# Patient Record
Sex: Female | Born: 1955 | Race: Black or African American | Hispanic: No | Marital: Married | Smoking: Never smoker
Health system: Southern US, Community
[De-identification: ages and names within clinical notes are randomized; demographics above are authoritative.]

## PROBLEM LIST (undated history)

## (undated) ENCOUNTER — Ambulatory Visit: Admission: EM | Payer: Medicare HMO

## (undated) DIAGNOSIS — T7840XA Allergy, unspecified, initial encounter: Secondary | ICD-10-CM

## (undated) DIAGNOSIS — E785 Hyperlipidemia, unspecified: Secondary | ICD-10-CM

## (undated) DIAGNOSIS — E079 Disorder of thyroid, unspecified: Secondary | ICD-10-CM

## (undated) DIAGNOSIS — I82409 Acute embolism and thrombosis of unspecified deep veins of unspecified lower extremity: Secondary | ICD-10-CM

## (undated) HISTORY — DX: Disorder of thyroid, unspecified: E07.9

## (undated) HISTORY — PX: VASCULAR SURGERY: SHX849

## (undated) HISTORY — DX: Allergy, unspecified, initial encounter: T78.40XA

## (undated) HISTORY — DX: Acute embolism and thrombosis of unspecified deep veins of unspecified lower extremity: I82.409

---

## 1998-07-18 HISTORY — PX: CHOLECYSTECTOMY: SHX55

## 1998-07-18 HISTORY — PX: TOTAL NEPHRECTOMY: SHX415

## 2001-08-23 ENCOUNTER — Emergency Department (HOSPITAL_COMMUNITY): Admission: EM | Admit: 2001-08-23 | Discharge: 2001-08-23 | Payer: Self-pay | Admitting: *Deleted

## 2001-09-20 ENCOUNTER — Emergency Department (HOSPITAL_COMMUNITY): Admission: EM | Admit: 2001-09-20 | Discharge: 2001-09-20 | Payer: Self-pay | Admitting: Emergency Medicine

## 2002-04-15 ENCOUNTER — Emergency Department (HOSPITAL_COMMUNITY): Admission: EM | Admit: 2002-04-15 | Discharge: 2002-04-15 | Payer: Self-pay | Admitting: Emergency Medicine

## 2005-02-15 ENCOUNTER — Encounter (INDEPENDENT_AMBULATORY_CARE_PROVIDER_SITE_OTHER): Payer: Self-pay | Admitting: Internal Medicine

## 2005-02-15 LAB — CONVERTED CEMR LAB: Pap Smear: NORMAL

## 2006-10-29 ENCOUNTER — Ambulatory Visit: Payer: Self-pay | Admitting: Internal Medicine

## 2006-11-02 ENCOUNTER — Encounter: Payer: Self-pay | Admitting: Internal Medicine

## 2006-11-02 DIAGNOSIS — E785 Hyperlipidemia, unspecified: Secondary | ICD-10-CM

## 2006-11-02 DIAGNOSIS — J309 Allergic rhinitis, unspecified: Secondary | ICD-10-CM | POA: Insufficient documentation

## 2006-11-02 DIAGNOSIS — K219 Gastro-esophageal reflux disease without esophagitis: Secondary | ICD-10-CM

## 2007-02-19 ENCOUNTER — Encounter (INDEPENDENT_AMBULATORY_CARE_PROVIDER_SITE_OTHER): Payer: Self-pay | Admitting: Internal Medicine

## 2008-05-18 ENCOUNTER — Ambulatory Visit: Payer: Self-pay | Admitting: Internal Medicine

## 2008-05-18 DIAGNOSIS — R634 Abnormal weight loss: Secondary | ICD-10-CM

## 2008-05-23 LAB — CONVERTED CEMR LAB
AST: 14 units/L (ref 0–37)
Albumin: 4.1 g/dL (ref 3.5–5.2)
Basophils Absolute: 0 10*3/uL (ref 0.0–0.1)
Basophils Relative: 0 % (ref 0–1)
CO2: 27 meq/L (ref 19–32)
Calcium: 9 mg/dL (ref 8.4–10.5)
Chloride: 107 meq/L (ref 96–112)
Eosinophils Absolute: 0.1 10*3/uL (ref 0.0–0.7)
HCT: 38.9 % (ref 36.0–46.0)
Hemoglobin: 12.4 g/dL (ref 12.0–15.0)
LDL Cholesterol: 152 mg/dL — ABNORMAL HIGH (ref 0–99)
MCV: 93.3 fL (ref 78.0–100.0)
Monocytes Relative: 9 % (ref 3–12)
Neutro Abs: 3.5 10*3/uL (ref 1.7–7.7)
Neutrophils Relative %: 57 % (ref 43–77)
Platelets: 271 10*3/uL (ref 150–400)
Potassium: 3.9 meq/L (ref 3.5–5.3)
RBC: 4.17 M/uL (ref 3.87–5.11)
RDW: 13.4 % (ref 11.5–15.5)
Sodium: 144 meq/L (ref 135–145)
Total Bilirubin: 0.4 mg/dL (ref 0.3–1.2)
Total Protein: 7.8 g/dL (ref 6.0–8.3)
Triglycerides: 62 mg/dL (ref <150)

## 2008-05-24 ENCOUNTER — Ambulatory Visit (HOSPITAL_COMMUNITY): Admission: RE | Admit: 2008-05-24 | Discharge: 2008-05-24 | Payer: Self-pay | Admitting: Internal Medicine

## 2008-05-26 ENCOUNTER — Telehealth (INDEPENDENT_AMBULATORY_CARE_PROVIDER_SITE_OTHER): Payer: Self-pay | Admitting: *Deleted

## 2008-05-30 ENCOUNTER — Ambulatory Visit: Payer: Self-pay | Admitting: Internal Medicine

## 2008-05-30 DIAGNOSIS — N289 Disorder of kidney and ureter, unspecified: Secondary | ICD-10-CM | POA: Insufficient documentation

## 2008-05-30 DIAGNOSIS — E042 Nontoxic multinodular goiter: Secondary | ICD-10-CM

## 2008-06-01 ENCOUNTER — Encounter (INDEPENDENT_AMBULATORY_CARE_PROVIDER_SITE_OTHER): Payer: Self-pay | Admitting: Internal Medicine

## 2008-06-01 ENCOUNTER — Ambulatory Visit (HOSPITAL_COMMUNITY): Admission: RE | Admit: 2008-06-01 | Discharge: 2008-06-01 | Payer: Self-pay | Admitting: Internal Medicine

## 2008-06-06 ENCOUNTER — Ambulatory Visit: Payer: Self-pay | Admitting: Internal Medicine

## 2008-06-21 ENCOUNTER — Ambulatory Visit (HOSPITAL_COMMUNITY): Admission: RE | Admit: 2008-06-21 | Discharge: 2008-06-21 | Payer: Self-pay | Admitting: Internal Medicine

## 2008-06-21 ENCOUNTER — Ambulatory Visit: Payer: Self-pay | Admitting: Internal Medicine

## 2008-06-21 ENCOUNTER — Encounter (INDEPENDENT_AMBULATORY_CARE_PROVIDER_SITE_OTHER): Payer: Self-pay | Admitting: Internal Medicine

## 2008-06-21 ENCOUNTER — Encounter: Payer: Self-pay | Admitting: Internal Medicine

## 2009-08-15 ENCOUNTER — Encounter (INDEPENDENT_AMBULATORY_CARE_PROVIDER_SITE_OTHER): Payer: Self-pay | Admitting: Internal Medicine

## 2009-12-04 IMAGING — CT CT PELVIS W/ CM
1 of 3 series · 14 of 32 positions shown, 19 images · IV contrast (Omnipaque 300)
Comparison: None

CT ABDOMEN

CLINICAL DATA: Weight loss.

CT ABDOMEN AND PELVIS WITH CONTRAST
TECHNIQUE: Multidetector CT imaging of the abdomen and pelvis was
performed using the standard protocol following bolus
administration of intravenous contrast.
Contrast: 100 ml of omni 300

[Series 2: abd_pel 5.0 b40f · axial · 0.67mm/px · z∈[-418,-72]mm · 14 of 79 slices shown, 19 images]
[im 5/79  soft-tissue]
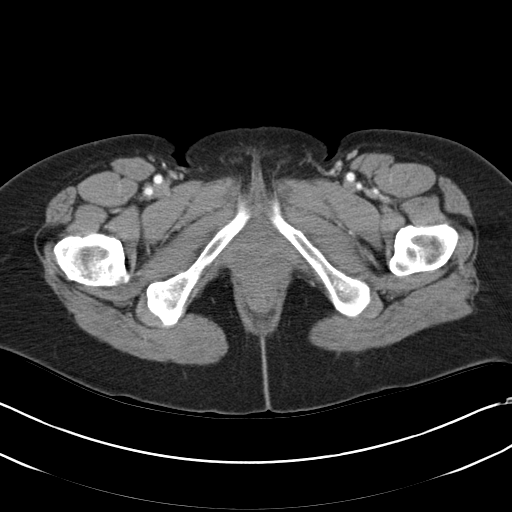
[im 5/79  bone]
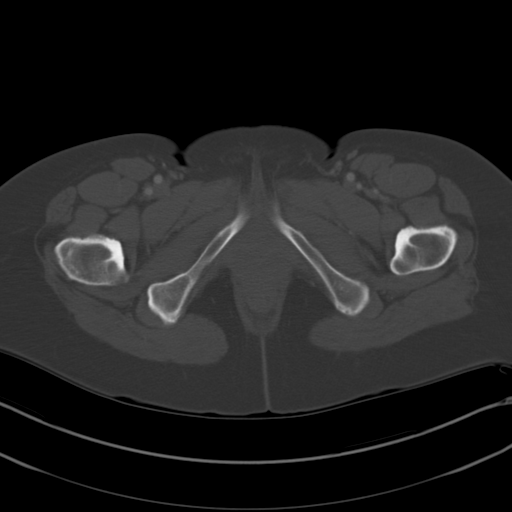
[im 9/79  soft-tissue]
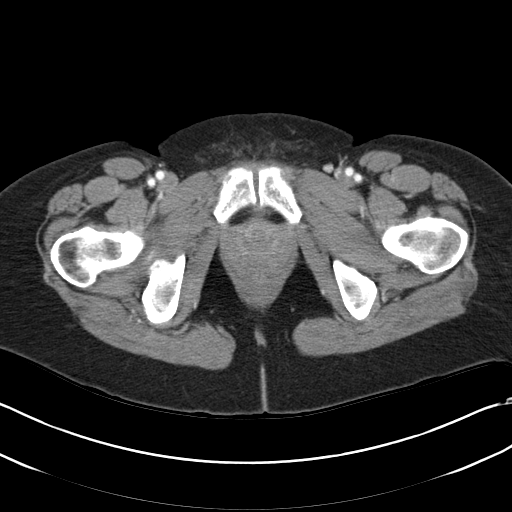
[im 18/79  soft-tissue]
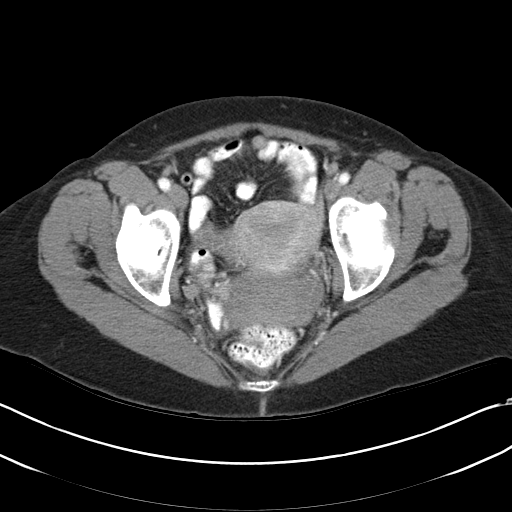
[im 22/79  soft-tissue]
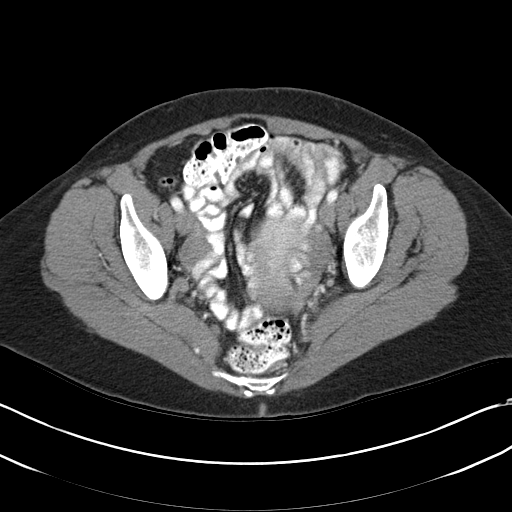
[im 27/79  soft-tissue]
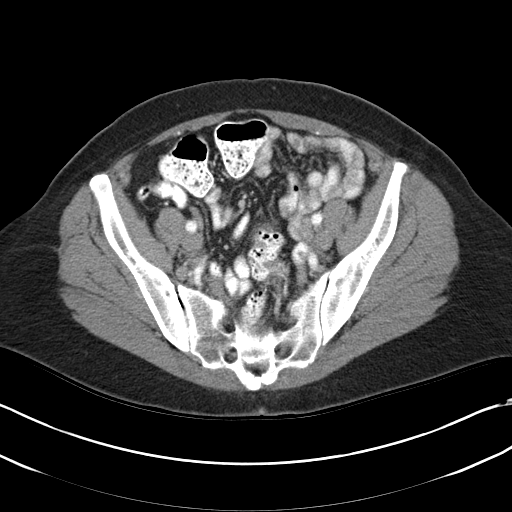
[im 35/79  soft-tissue]
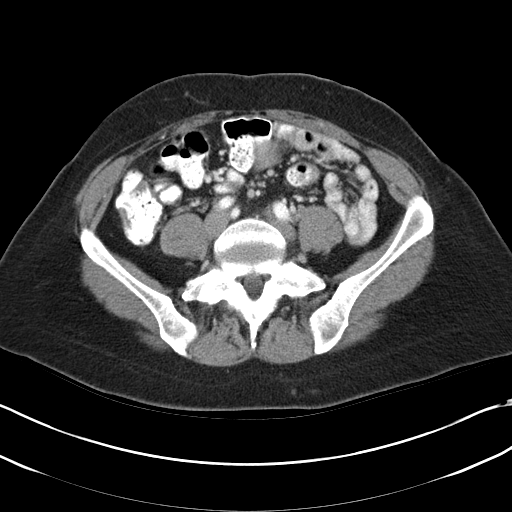
[im 40/79  soft-tissue]
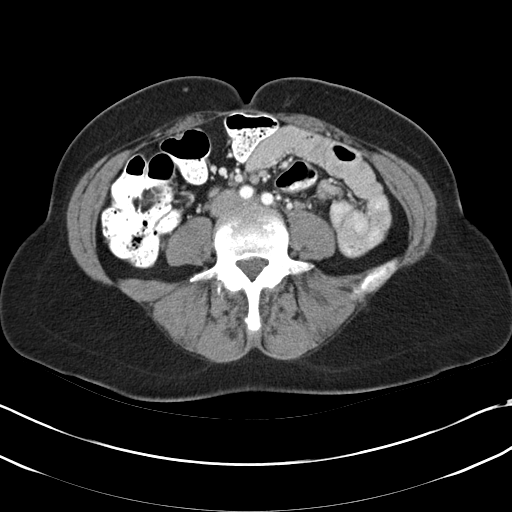
[im 44/79  soft-tissue]
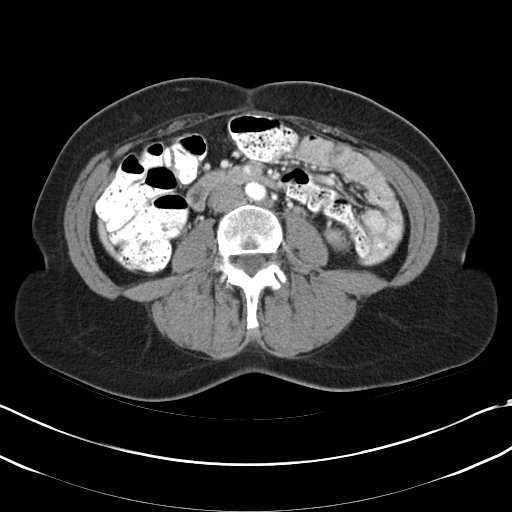
[im 53/79  soft-tissue]
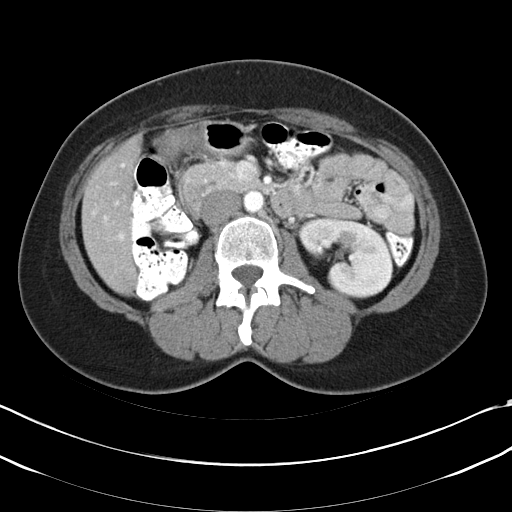
[im 53/79  bone]
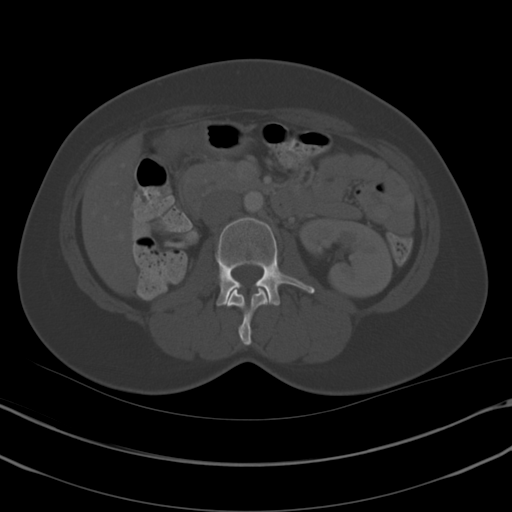
[im 57/79  soft-tissue]
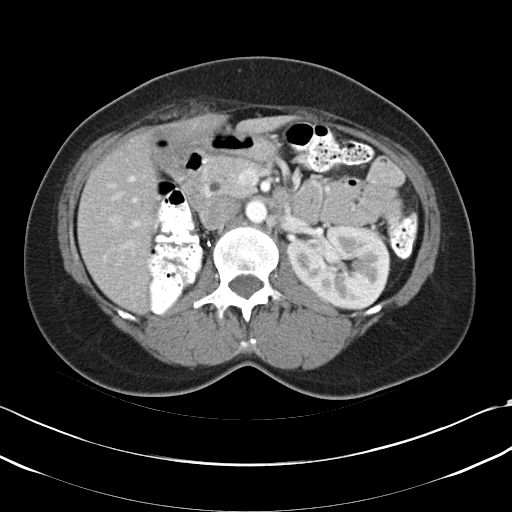
[im 61/79  soft-tissue]
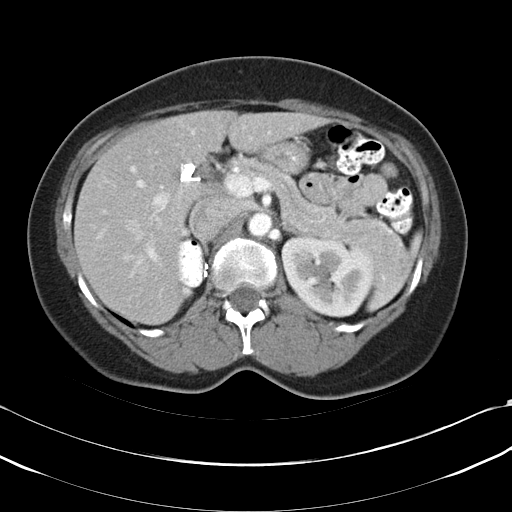
[im 61/79  lung]
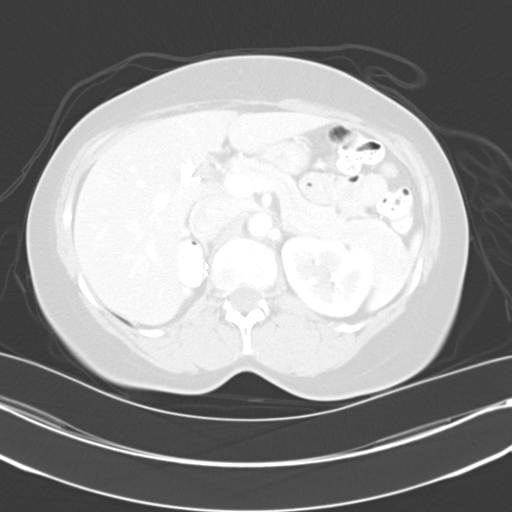
[im 66/79  lung]
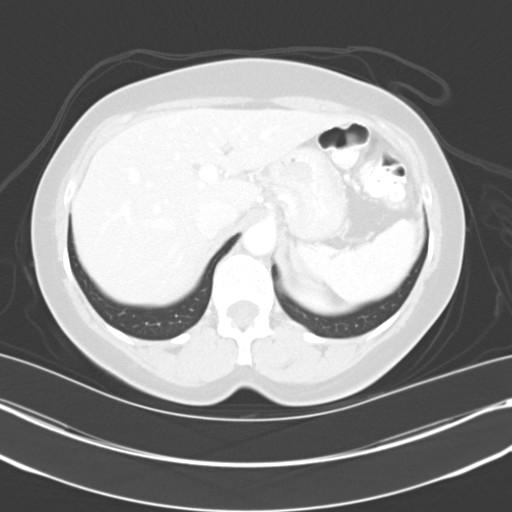
[im 70/79  soft-tissue]
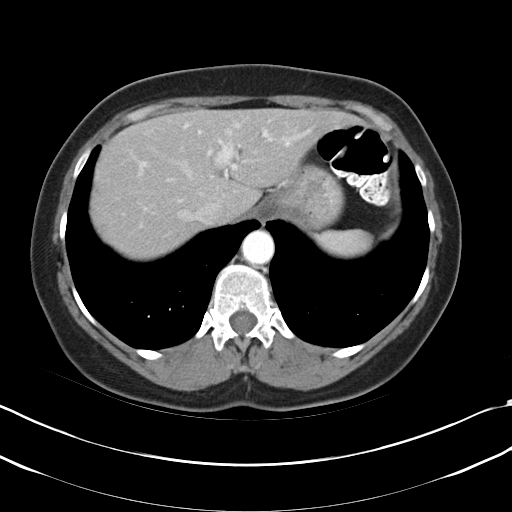
[im 70/79  lung]
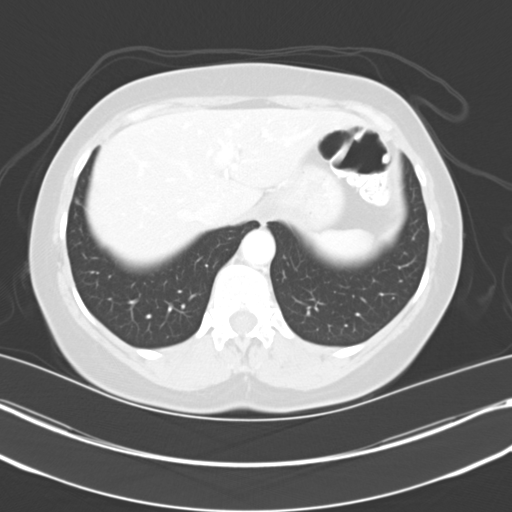
[im 74/79  soft-tissue]
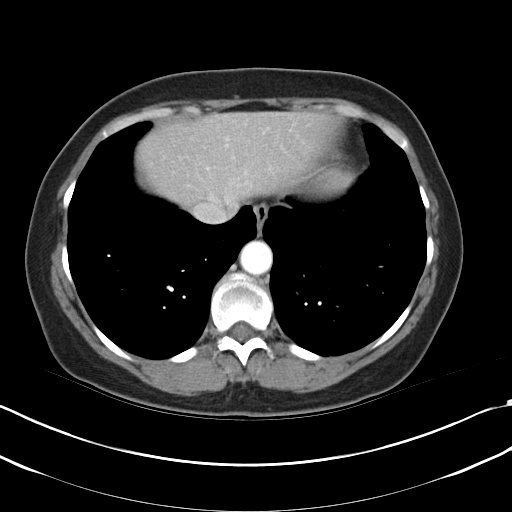
[im 74/79  lung]
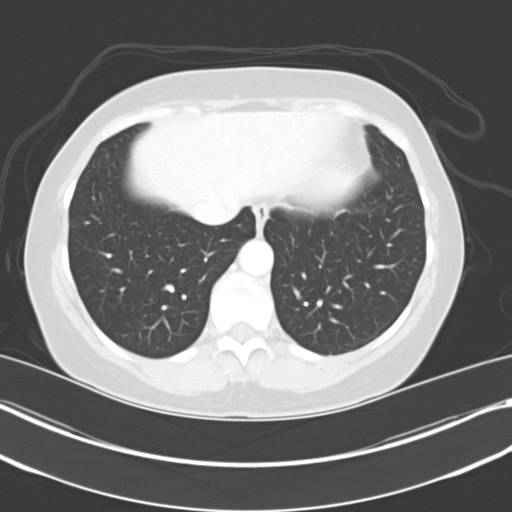

[14 of 32 positions shown; findings below may reference images not displayed]

FINDINGS: The lung bases are clear.

There is no pericardial or pleural fluid.

Spleen negative.

Adrenal glands negative.

The patient is status post cholecystectomy.  The common bile duct
is mildly increased in caliber measuring 8 mm. There is no
intrahepatic biliary ductal dilatation.

There is mild fatty infiltration of the liver.Within the right
hepatic lobe there is a low density lesion measuring 8.7 mm, image
26.  This is too small to characterize.

The pancreas appears normal

The patient is status post right nephrectomy.  The left kidney
appears normal

There is no enlarged retroperitoneal or small bowel mesenteric
lymph nodes.

No free fluid or abnormal fluid collections noted.

The appendix is identified the right lower quadrant and appears
normal.

Review of the visualized osseous structures is unremarkable.
IMPRESSION: 1.  Negative for mass or adenopathy.
2.  8.7 mm hypodensity in the right hepatic lobe is too small to
characterize.
3.  Status post right nephrectomy.

CT PELVIS
FINDINGS: Urinary bladder is negative.

The uterus and the adnexal structures are unremarkable.

There are no enlarged pelvic or inguinal lymph nodes.

The pelvic bowel loops are unremarkable.

There is no free fluid, or abnormal fluid collections.

No pelvic masses identified.

Review of the visualized osseous structures shows facet hypertrophy
and degenerative change.
IMPRESSION: 1.  Negative for mass or adenopathy.

## 2010-01-29 ENCOUNTER — Ambulatory Visit: Payer: Self-pay | Admitting: Family Medicine

## 2010-01-29 DIAGNOSIS — R5383 Other fatigue: Secondary | ICD-10-CM

## 2010-01-29 DIAGNOSIS — R5381 Other malaise: Secondary | ICD-10-CM

## 2010-02-05 ENCOUNTER — Ambulatory Visit (HOSPITAL_COMMUNITY): Admission: RE | Admit: 2010-02-05 | Discharge: 2010-02-05 | Payer: Self-pay | Admitting: Family Medicine

## 2010-02-25 ENCOUNTER — Encounter: Payer: Self-pay | Admitting: Family Medicine

## 2010-03-18 ENCOUNTER — Encounter: Payer: Self-pay | Admitting: Family Medicine

## 2010-04-11 ENCOUNTER — Ambulatory Visit: Payer: Self-pay | Admitting: Family Medicine

## 2010-04-12 LAB — CONVERTED CEMR LAB
CO2: 26 meq/L (ref 19–32)
Chloride: 103 meq/L (ref 96–112)
Creatinine, Ser: 0.79 mg/dL (ref 0.40–1.20)
Glucose, Bld: 80 mg/dL (ref 70–99)
Sodium: 141 meq/L (ref 135–145)

## 2010-12-17 NOTE — Assessment & Plan Note (Signed)
Summary: new pt   Vital Signs:  Patient profile:   55 year old female Height:      65 inches Weight:      150 pounds BMI:     25.05 O2 Sat:      98 % Pulse rate:   84 / minute Pulse rhythm:   regular Resp:     16 per minute BP sitting:   134 / 90  (left arm) Cuff size:   regular  Vitals Entered By: Everitt Amber LPN (January 29, 2010 10:34 AM)  Nutrition Counseling: Patient's BMI is greater than 25 and therefore counseled on weight management options. CC: New Patient, wants thyroid checked, her pressure went up last year and her left ankle swelled up and now she has veins and spots on her lower leg Is Patient Diabetic? No   CC:  New Patient, wants thyroid checked, and her pressure went up last year and her left ankle swelled up and now she has veins and spots on her lower leg.  History of Present Illness: this is a new pt evaluation for this patient whose main concerns are regarding having her thyroid gland checked and re-establishment of care. She is not up to date in her preventive care and also wants this addressed. She denies any recent fever or chills. She denies any regular exercise and she has actually gained weight. she reports being somewhat stressed and overwhelmed, between work, school and home making.  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  Past Medical  History: Reviewed history from 05/30/2008 and no changes required. Allergic rhinitis GERD Hyperlipidemia ? kidney cancer Multinodular goiter   Past Surgical History: Reviewed history from 05/18/2008 and no changes required. Nephrectomy-right-1999 Cholecystectomy  Family History: Reviewed history from 03/16/2007 and no changes required. father--CVA, HTN mother sister x 2 both with thyroid disease brother-x1 healthy  Social History: Reviewed history from 05/18/2008 and no changes required. Married lives with spouse and son Alcohol use-yes-socially Never Smoked Drug use-no Health visitor and nutrition products, Designer, jewellery for healthcare profession two sons , both healthy  Review of Systems      See HPI General:  Denies chills and fever. Eyes:  Denies blurring, discharge, double vision, and red eye. ENT:  Denies earache, hoarseness, nasal congestion, and sinus pressure. CV:  Denies chest pain or discomfort, palpitations, and swelling of feet. Resp:  Denies cough and sputum productive. GI:  Denies abdominal pain, constipation, diarrhea, nausea, and vomiting. GU:  Denies dysuria, incontinence, and urinary frequency. MS:  Denies joint pain, low back pain, mid back pain, muscle weakness, and stiffness. Derm:  Complains of rash; non puritic rash on legs, also hypopigmenrted lesions on legs, mom has vitiligo. Neuro:  Complains of headaches; denies seizures, sensation of room spinning, tingling, and tremors; opccasional headaches, esp under stress. Psych:  Complains of anxiety; denies depression, sense of great danger, suicidal thoughts/plans, thoughts of violence, and unusual visions or sounds. Endo:  Denies cold intolerance, excessive hunger, excessive thirst, excessive  urination, and heat intolerance. Heme:  Complains of abnormal bruising; bruising on legs. Allergy:  Denies hives or rash and sneezing.  Physical Exam  General:  Well-developed,well-nourished,in no acute  distress; alert,appropriate and cooperative throughout examination HEENT: No facial asymmetry,  EOMI, No sinus tenderness, TM's Clear, oropharynx  pink and moist. thyromegaly  Chest: Clear to auscultation bilaterally.  CVS: S1, S2, No murmurs, No S3.   Abd: Soft, Nontender.  MS: Adequate ROM spine, hips, shoulders and knees.  Ext: No edema.   CNS: CN 2-12 intact, power tone and sensation normal throughout.   Skin: Intact, no visible lesions or rashes.  Psych: Good eye contact, normal affect.  Memory intact, not anxious or depressed appearing.    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 the importance of healthy nutrition, regular exercise , and adequate rest was stressed.  Other Orders: T-Lipid Profile [phone]) T-Vitamin D (25-Hydroxy) [phone]) Tdap => 22yrs IM ([id number]) Admin 1st Vaccine (34742) Admin 1st Vaccine [facility]) [phone])  Patient Instructions: 1)  CPE in 2 months. 2)  BMP prior to visit, ICD-9: 3)  Lipid Panel prior to visit, ICD-9: 4)  TSH prior to visit, ICD-9:   fasting asap 5)  CBC w/ Diff prior to visit, ICD-9: 6)  vit d  7)  It is important that you exercise regularly at least 20 minutes 5 times a week. If you develop chest pain, have severe difficulty breathing, or feel very tired , stop exercising immediately and seek medical attention. 8)  You need to lose weight. Consider a lower calorie diet and regular exercise.  9)  Check your Blood Pressure regularly. If it is above150/90: you should make an appointment.    Tetanus/Td Vaccine    Vaccine Type: Tdap     Site: right deltoid    Mfr: Sanofi Pasteur    Dose: 0.5 ml    Route: IM    Given by: Everitt Amber LPN    Exp. Date: 02/2012    Lot #: [id number]

## 2010-12-17 NOTE — Progress Notes (Signed)
Summary: ENT  ENT   Imported By: Lind Guest 02/26/2010 12:57:32  _____________________________________________________________________  External Attachment:    Type:   Image     Comment:   External Document

## 2010-12-17 NOTE — Progress Notes (Signed)
Summary: ENT  ENT   Imported By: Lind Guest 03/19/2010 11:27:43  _____________________________________________________________________  External Attachment:    Type:   Image     Comment:   External Document

## 2010-12-17 NOTE — Assessment & Plan Note (Signed)
Summary: office visit   Vital Signs:  Patient profile:   55 year old female Height:      65 inches Weight:      147 pounds BMI:     24.55 O2 Sat:      97 % on Room air Pulse rate:   88 / minute Resp:     16 per minute BP sitting:   112 / 70  (left arm)  Vitals Entered By: Adella Hare LPN (Apr 11, 2010 10:50 AM)  O2 Flow:  Room air CC: follow-up visit Is Patient Diabetic? No Pain Assessment Patient in pain? no        CC:  follow-up visit.  History of Present Illness: Prt states she feels muchj better knowing that the swelling in her neck was nothing to be concerned about and thateNT would follow her on an intermittentbasis. She reports continued and increased stress, between work as welll as as hr studies, ansd she unfortunately lost 3 peopleclose to herin the month of April;. She diloes have a good support system to get her through this.  Current Medications (verified): 1)  None  Allergies (verified): No Known Drug Allergies  Review of Systems      See HPI General:  Complains of fatigue and weight loss; denies chills and fever. Eyes:  Denies blurring and discharge. ENT:  Denies hoarseness, nasal congestion, sinus pressure, and sore throat. CV:  Denies chest pain or discomfort, palpitations, and swelling of feet. Resp:  Denies cough and sputum productive. GI:  Denies abdominal pain, constipation, diarrhea, nausea, and vomiting. GU:  Denies dysuria and urinary frequency. MS:  Denies joint pain and stiffness. Derm:  Denies itching and rash. Neuro:  Denies headaches, inability to speak, and memory loss. Psych:  acute grief  lost 3 people close to her in April, crying spells, excessive crying . Endo:  Denies excessive thirst, excessive urination, and polyuria; father has diabetes and grecent lab data show an elevated HBA1C , this will be repeated. Heme:  Denies abnormal bruising and bleeding. Allergy:  Denies hives or rash and itching eyes.  Physical  Exam  General:  Well-developed,well-nourished,in no acute distress; alert,appropriate and cooperative throughout examination HEENT: No facial asymmetry,  EOMI, No sinus tenderness, TM's Clear, oropharynx  pink and moist. thyromegaly  Chest: Clear to auscultation bilaterally.  CVS: S1, S2, No murmurs, No S3.   Abd: Soft, Nontender.  MS: Adequate ROM spine, hips, shoulders and knees.  Ext: No edema.   CNS: CN 2-12 intact, power tone and sensation normal throughout.   Skin: Intact, no visible lesions or rashes.  Psych: Good eye contact, normal affect.  Memory intact, not anxious or depressed appearing.    Impression & Recommendations:  Problem # 1:  IMPAIRED FASTING GLUCOSE (ICD-790.21) Assessment Comment Only  Orders: T- Hemoglobin A1C ([id number]), recent oputside lab has a value of 6.9, pt advised she needs tochange her diet and commit to reg exercise as she islikely diabetic , she will be contacted in the morning when labs become available  Labs Reviewed: Creat: 0.86 (05/18/2008)     Problem # 2:  GOITER, NONTOXIC MULTINODULAR (ICD-241.1) Assessment: Unchanged pt reassured of benign nature at this time  Other Orders: T-Basic Metabolic Panel [phone]) T-Vitamin D (25-Hydroxy) [phone])  Patient Instructions: 1)  Please schedule a follow-up appointment in 6 to 7 weeks. 2)  chem7 and HBA1c today. 3)  we will call with result tomorrow, recent labs suggest you are diabetic, pls start low carb diet with  no conc /low conc sweets. 4)  Eat alot of vegetables and somefruit, and drink water or no cal drinks. 5)  Youyur blood pressure is excellentat 120/80 6)  It is important that you exercise regularly at least 30 minutes 6 times a week. If you develop chest pain, have severe difficulty breathing, or feel very tired , stop exercising immediately and seek medical attention. 7)  You need to lose weight. Consider a lower calorie diet and regular exercise.

## 2011-04-01 NOTE — H&P (Signed)
Carolyn Raymond           ACCOUNT NO.:  [id number]   MEDICAL RECORD NO.:  [id number]          Raymond TYPE:  OUT   LOCATION:  RAD                           FACILITY:  [facility]   PHYSICIAN:  R. Roetta Sessions, M.D. DATE OF BIRTH:  [birth year]   DATE OF ADMISSION:  06/01/2008  DATE OF DISCHARGE:  07/16/2009LH                              HISTORY & PHYSICAL   REASON FOR CONSULTATION:  Weight loss, need for colonoscopy.   REFERRING PHYSICIAN:  Erle Crocker, MD.   HISTORY OF PRESENT ILLNESS:  Carolyn Raymond is a very pleasant 55-  year-old [place] American female from [place] who was referred here  by Erle Crocker.   It has been noted that Carolyn Raymond has sustained a 10-pound weight loss  insidiously over Carolyn past 3 months.  This has not been associated with  nausea, vomiting, or change in bowel habits.  She has not had any  abdominal pain aside from a self-limiting episode of right upper  quadrant abdominal pain, nausea, and vomiting on Memorial Day weekend at  her sister's back in May.   She denies odynophagia, dysphagia, early satiety, reflux symptoms.  She  moves her bowels once daily.  No melena or hematochezia.  She has a  history of a thyroid goiter.  She has had at least one thyroid  ultrasound.  Her TSH was determined to be normal recently (see below).  Carolyn Raymond has never had her lower GI tract evaluated.  There is  positive family history of colonic polyps in her father, but no family  history of colorectal neoplasia.   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 Rito Ehrlich and Elpidio Anis), history of thyroid goiter.   PAST SURGERIES:  Other surgery include cholecystectomy.   CURRENT MEDICATIONS:  1. Antioxidant tablets daily.  2. Vitamin C daily.   ALLERGIES:  No known drug allergies.   FAMILY HISTORY:  Father is alive with history of CVA, hypertension, and  colonic polyps.  Mother is alive with an  apparent good health.  Two  sisters and one brother in good health.   SOCIAL HISTORY:  Carolyn Raymond is married.  She has two children.  Lives  here in [place].  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  No tobacco.  No alcohol.  No illicit drugs.   REVIEW OF SYSTEMS:  No chest pain, dyspnea on exertion.  No fever,  chills, or night sweats.  Otherwise, as in history of present illness.   PHYSICAL EXAMINATION:  Pleasant 55 year old lady, resting comfortably.  Weight 140, height 5 feet 5, temp 98.5, BP 110/68, pulse 80.  SKIN:  Warm and dry.  HEENT:  No scleral icterus.  Conjunctivae are pink.  Oral cavity no  lesions.  CHEST:  Lungs are clear to auscultation.  CARDIAC:  Regular rate and rhythm without murmur, gallop, or rub.  ABDOMEN:  Nondistended.  Positive bowel sounds, soft, entirely nontender  without appreciable mass or organomegaly.  EXTREMITIES:  No edema.  RECTAL:  Exam deferred.  She will have colonoscopy.   IMPRESSIONS:  Carolyn Raymond is a pleasant 51-year lady  essentially devoid of any GI symptoms aside from a self-limiting episode  of right upper quadrant pain, nausea, and vomiting within 48 hours 2  months ago who now comes for further evaluation of insidious weight loss  and consideration of colorectal cancer screening.  At this point, she  has no specific GI or other symptoms.  It sounds like her thyroid issues  are being addressed by Dr. Jen Mow.   At this point, I agree with Dr. Jen Mow.  Carolyn Raymond certainly needs to  have a colonoscopy largely for screening purposes at this point.  Discussed Carolyn risks, benefits, alternatives, and limitations of  screening colonoscopy at this time.  Her questions were answered, and  she is agreeable.  We will plan to set up colonoscopy in Carolyn very near  future.   I would like to thank Dr. Jen Mow for allowing [place] to see this nice lady  today.      Jonathon Bellows, M.D.  Electronically Signed     RMR/MEDQ   D:  06/06/2008  T:  06/07/2008  Job:  [id number]

## 2011-04-01 NOTE — Op Note (Signed)
NAMESHARNETTA, GIELOW           ACCOUNT NO.:  [id number]   MEDICAL RECORD NO.:  [id number]          PATIENT TYPE:  AMB   LOCATION:  DAY                           FACILITY:  [facility]   PHYSICIAN:  R. Roetta Sessions, M.D. DATE OF BIRTH:  07/28/1956   DATE OF PROCEDURE:  DATE OF DISCHARGE:                               OPERATIVE REPORT   INDICATIONS FOR PROCEDURE:  A 55 year old lady with no GI tract symptoms  currently, again comes for colorectal cancer screening.  She has had  insidious 10-pound weight loss over the past 3 months, but otherwise  entirely asymptomatic.  From a GI standpoint, there is no family history  of colorectal neoplasia.  She has never had a lower GI tract imaged  previously.  Colonoscopy is now being done as a screening maneuver.  Risks, benefits, alternatives, and limitations have been reviewed,  questions answered.  She is agreeable.  Please see the documentation in  the medical record.   PROCEDURE NOTE:  O2 saturation, blood pressure, pulse, and respirations  monitored throughout the entire procedure.   CONSCIOUS SEDATION:  Versed 4 mg IV and Demerol 75 mg IV in divided  doses.   INSTRUMENT:  Pentax video chip system.   FINDINGS:  Digital rectal exam revealed no abnormalities. Endoscopic  findings: The prep was good.  Colon: Colonic mucosa was surveyed from  the rectosigmoid junction through the left transverse, right colon,  appendiceal orifice, ileocecal valve, and cecum. These structures were  well seen and photographed for the record.  The right terminal ileum was  intubated to 10 cm.  From this level, the scope was slowly withdrawn.  All previously mentioned mucosal surfaces were again seen.  The patient  was noted to have left-sided diverticula,  nonbleeding small AVM and  hepatic flexure.  The terminal ileal mucosa appeared normal.  The  colonic mucosa otherwise appeared normal except for a single diminutive  polyp at the rectosigmoid junction,  which was cold biopsied and  recovered through the scope.  The scope was pulled down to the rectum  with examination of rectal mucosa, including retroflexed view of the  anal verge, demonstrated no abnormalities.  The patient tolerated the  procedure well and was reacted in Endoscopy.   IMPRESSION:  1. Normal rectum, diminutive polyp at rectosigmoid, status post cold      biopsy removal.  2. 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  Remainder of the colonic mucosa and      terminal ileal mucosa appeared normal.   RECOMMENDATIONS:  1. Follow up on path.  2. Diverticulosis.  Literature provided to [name].  3. Further recommendations to follow.      Jonathon Bellows, M.D.  Electronically Signed     RMR/MEDQ  D:  06/21/2008  T:  06/22/2008  Job:  [id number]   cc:   Erle Crocker, M.D.

## 2013-07-04 ENCOUNTER — Ambulatory Visit (INDEPENDENT_AMBULATORY_CARE_PROVIDER_SITE_OTHER): Payer: BC Managed Care – PPO | Admitting: Family Medicine

## 2013-07-04 ENCOUNTER — Encounter: Payer: Self-pay | Admitting: Family Medicine

## 2013-07-04 ENCOUNTER — Other Ambulatory Visit: Payer: Self-pay | Admitting: Family Medicine

## 2013-07-04 VITALS — BP 104/72 | HR 80 | Temp 98.1°F | Resp 18 | Ht 63.25 in | Wt 139.0 lb

## 2013-07-04 DIAGNOSIS — Z1321 Encounter for screening for nutritional disorder: Secondary | ICD-10-CM

## 2013-07-04 DIAGNOSIS — Z1231 Encounter for screening mammogram for malignant neoplasm of breast: Secondary | ICD-10-CM

## 2013-07-04 DIAGNOSIS — R634 Abnormal weight loss: Secondary | ICD-10-CM

## 2013-07-04 DIAGNOSIS — Z130 Encounter for screening for diseases of the blood and blood-forming organs and certain disorders involving the immune mechanism: Secondary | ICD-10-CM

## 2013-07-04 DIAGNOSIS — E785 Hyperlipidemia, unspecified: Secondary | ICD-10-CM

## 2013-07-04 DIAGNOSIS — Z124 Encounter for screening for malignant neoplasm of cervix: Secondary | ICD-10-CM

## 2013-07-04 DIAGNOSIS — Z0000 Encounter for general adult medical examination without abnormal findings: Secondary | ICD-10-CM

## 2013-07-04 DIAGNOSIS — R109 Unspecified abdominal pain: Secondary | ICD-10-CM

## 2013-07-04 DIAGNOSIS — B9689 Other specified bacterial agents as the cause of diseases classified elsewhere: Secondary | ICD-10-CM

## 2013-07-04 DIAGNOSIS — N760 Acute vaginitis: Secondary | ICD-10-CM

## 2013-07-04 DIAGNOSIS — A499 Bacterial infection, unspecified: Secondary | ICD-10-CM

## 2013-07-04 DIAGNOSIS — E041 Nontoxic single thyroid nodule: Secondary | ICD-10-CM

## 2013-07-04 DIAGNOSIS — E079 Disorder of thyroid, unspecified: Secondary | ICD-10-CM

## 2013-07-04 LAB — URINALYSIS, ROUTINE W REFLEX MICROSCOPIC
Bilirubin Urine: NEGATIVE
Glucose, UA: NEGATIVE mg/dL
Ketones, ur: NEGATIVE mg/dL
Leukocytes, UA: NEGATIVE
Nitrite: NEGATIVE
Specific Gravity, Urine: >1.03 — ABNORMAL HIGH (ref 1.005–1.030)

## 2013-07-04 LAB — URINALYSIS, MICROSCOPIC ONLY: Crystals: NONE SEEN

## 2013-07-04 LAB — WET PREP FOR TRICH, YEAST, CLUE

## 2013-07-04 MED ORDER — METRONIDAZOLE 500 MG PO TABS: 500.0000 mg | ORAL_TABLET | Freq: Two times a day (BID) | ORAL | Status: DC

## 2013-07-04 NOTE — Progress Notes (Signed)
  Subjective:    Patient ID: Carolyn Raymond, female    DOB: [birth year], 57 y.o.   MRN: [id number]  HPI  Pt here to establish care, previous PCP Dr. Lodema Hong, last seeen in 2011. She is followed by ENT- Dr. Andrey Campanile due to thyroid nodules which are benign, had ultrasound done in January of this year. No current meds, history reviewed Complains of abd pain over site of scar from right nephrectomy for benign tumor and cholecystectomy done in 1999. Has pain when she stretches out at night, denies N/V, diarrhea, pain with eating.  Complains of vaginal discharge for past couple of weeks, no pelvic pain or dysuria.  History of hyperlipidemia- has not had labs in > 3 years  Review of Systems - per above  GEN- denies fatigue, fever, weight loss,weakness, recent illness HEENT- denies eye drainage, change in vision, nasal discharge, CVS- denies chest pain, palpitations RESP- denies SOB, cough, wheeze ABD- denies N/V, change in stools, +abd pain GU- denies dysuria, hematuria, dribbling, incontinence MSK- denies joint pain, muscle aches, injury Neuro- denies headache, dizziness, syncope, seizure activity      Objective:   Physical Exam GEN- NAD, alert and oriented x3 HEENT- PERRL, EOMI, non injected sclera, pink conjunctiva, MMM, oropharynx clear, TM clear bilat Neck- Supple, no thyromegaly CVS- RRR, no murmur RESP-CTAB Breast- normal symmetry, no nipple inversion,no nipple drainage, no nodules or lumps felt Nodes- no axillary nodes ABD-NABS,soft, mild TTP RUQ over previous scar, no rebound, no guarding GU- normal external genitalia, vaginal mucosa pink and moist, cervix visualized no growth, no blood form os, +discharge, no CMT, no ovarian masses, uterus normal size EXT- No edema Pulses- Radial, DP- 2+ Psych- teary eyed discussing father, not overly depressed or anxious appearing       Assessment & Plan:

## 2013-07-04 NOTE — Patient Instructions (Addendum)
I recommend eye visit once a year I recommend dental visit every 6 months Goal is to  Exercise 30 minutes 5 days a week We will call with lab results Start multivitamin Take the flagyl  Records release from Dr. Andrey CampanileJonita Albee [place] F/U 4 months

## 2013-07-05 DIAGNOSIS — G8929 Other chronic pain: Secondary | ICD-10-CM | POA: Insufficient documentation

## 2013-07-05 LAB — COMPREHENSIVE METABOLIC PANEL
CO2: 28 mEq/L (ref 19–32)
Calcium: 9.6 mg/dL (ref 8.4–10.5)
Chloride: 104 mEq/L (ref 96–112)
Creat: 0.76 mg/dL (ref 0.50–1.10)
Glucose, Bld: 99 mg/dL (ref 70–99)
Total Bilirubin: 0.6 mg/dL (ref 0.3–1.2)

## 2013-07-05 LAB — LIPID PANEL
HDL: 70 mg/dL (ref >39)
LDL Cholesterol: 156 mg/dL — ABNORMAL HIGH (ref 0–99)
Total CHOL/HDL Ratio: 3.4 Ratio
VLDL: 14 mg/dL (ref 0–40)

## 2013-07-05 LAB — CBC WITH DIFFERENTIAL/PLATELET
Basophils Absolute: 0 10*3/uL (ref 0.0–0.1)
Basophils Relative: 1 % (ref 0–1)
Eosinophils Absolute: 0.1 10*3/uL (ref 0.0–0.7)
Eosinophils Relative: 1 % (ref 0–5)
Hemoglobin: 13.2 g/dL (ref 12.0–15.0)
MCV: 91 fL (ref 78.0–100.0)
WBC: 6.2 10*3/uL (ref 4.0–10.5)

## 2013-07-05 LAB — TSH: TSH: 0.287 u[IU]/mL — ABNORMAL LOW (ref 0.350–4.500)

## 2013-07-05 LAB — LIPASE: Lipase: <10 U/L (ref 0–75)

## 2013-07-05 NOTE — Assessment & Plan Note (Signed)
CPE done, PAP Smear, immunizations UTD Mammogram to be scheduled

## 2013-07-05 NOTE — Assessment & Plan Note (Signed)
I think her pain is due to scar tissue, no other systemic symptoms, labs for now

## 2013-07-05 NOTE — Assessment & Plan Note (Signed)
She has been concerned about weight loss that started back in 2009, her weights were reviewed in chart, since 2011 down 8 pounds Labs, pap ,mammogram

## 2013-07-05 NOTE — Assessment & Plan Note (Signed)
Check fasting labs 

## 2013-07-05 NOTE — Assessment & Plan Note (Signed)
Flagyl BID x 7 days 

## 2013-07-05 NOTE — Assessment & Plan Note (Signed)
Check thyroid studies obtain , ENT notes

## 2013-07-07 ENCOUNTER — Ambulatory Visit (HOSPITAL_COMMUNITY)
Admission: RE | Admit: 2013-07-07 | Discharge: 2013-07-07 | Disposition: A | Discharge status: Home / Self Care | Payer: BC Managed Care – PPO | Source: Ambulatory Visit | Attending: Family Medicine | Admitting: Family Medicine

## 2013-07-07 DIAGNOSIS — Z1231 Encounter for screening mammogram for malignant neoplasm of breast: Secondary | ICD-10-CM | POA: Insufficient documentation

## 2013-07-11 ENCOUNTER — Encounter: Payer: Self-pay | Admitting: Family Medicine

## 2013-07-11 ENCOUNTER — Ambulatory Visit (INDEPENDENT_AMBULATORY_CARE_PROVIDER_SITE_OTHER): Payer: BC Managed Care – PPO | Admitting: Family Medicine

## 2013-07-11 VITALS — BP 110/70 | HR 80 | Temp 97.5°F | Resp 18 | Ht 64.0 in | Wt 142.0 lb

## 2013-07-11 DIAGNOSIS — E559 Vitamin D deficiency, unspecified: Secondary | ICD-10-CM | POA: Insufficient documentation

## 2013-07-11 DIAGNOSIS — E785 Hyperlipidemia, unspecified: Secondary | ICD-10-CM

## 2013-07-11 DIAGNOSIS — E042 Nontoxic multinodular goiter: Secondary | ICD-10-CM

## 2013-07-11 DIAGNOSIS — E0590 Thyrotoxicosis, unspecified without thyrotoxic crisis or storm: Secondary | ICD-10-CM

## 2013-07-11 MED ORDER — VITAMIN D (ERGOCALCIFEROL) 1.25 MG (50000 UNIT) PO CAPS: 50000.0000 [IU] | ORAL_CAPSULE | ORAL | Status: DC

## 2013-07-11 NOTE — Assessment & Plan Note (Signed)
Discussed dietary changes, she can also try red yeast rice She would like to hold on prescription statin and I think this is reasonable at this time

## 2013-07-11 NOTE — Assessment & Plan Note (Signed)
Reviewed ENT notes her last ultrasound was done in 2013 which showed stable nodule in the left isthmus as well as solid nodules in the left side of the gland and multiple cysts in the right side of the gland.

## 2013-07-11 NOTE — Progress Notes (Signed)
  Subjective:    Patient ID: Carolyn Raymond, female    DOB: 14-Nov-1956, 57 y.o.   MRN: [id number]  HPI  Patient here to followup labs. Labs reviewed and office. Her TSH was low with normal T3 and T4 concerning for subclinical hyperthyroidism. Her notes were reviewed from her ear nose and throat as well as her last ultrasound which revealed multiple nodules and cysts. She's not had a radio at uptake scan. She states that the thyroid nodules were found around the same time she was losing weight. Hyperlipidemia we discussed her cholesterol which was elevated from previous checks. She does try to watch what she eats    Review of Systems - per above     Objective:   Physical Exam GEN-NAD,alert and oriented x 3       Assessment & Plan:

## 2013-07-11 NOTE — Patient Instructions (Signed)
Carolyn Raymond for thyroid uptake scan- end of September  Start vitamin D supplement  Low cholesterol, low fat diet Check out red yeast rice  F/U in December

## 2013-07-11 NOTE — Assessment & Plan Note (Signed)
Obtain radio-uptake scan

## 2013-07-11 NOTE — Assessment & Plan Note (Signed)
Vit D 50,000IU q weekly x 12 weeks

## 2013-07-14 ENCOUNTER — Encounter (HOSPITAL_COMMUNITY): Payer: BC Managed Care – PPO

## 2013-07-15 ENCOUNTER — Other Ambulatory Visit (HOSPITAL_COMMUNITY): Payer: BC Managed Care – PPO

## 2013-07-25 ENCOUNTER — Telehealth: Payer: Self-pay | Admitting: Family Medicine

## 2013-07-25 NOTE — Telephone Encounter (Signed)
Pt was working out in the yard last week and has now started to break out in hives. She said that you know about her allergies and would like to see if you can send in some prednisone for her. She said that you have done this in the past. She has tried OTC allergy medications, but they do not last long.

## 2013-07-26 MED ORDER — METHYLPREDNISOLONE 4 MG PO KIT: PACK | ORAL | Status: DC

## 2013-07-26 NOTE — Telephone Encounter (Signed)
Medrol dose pak sent, follow instructions

## 2013-07-26 NOTE — Telephone Encounter (Signed)
Pt aware of message

## 2013-07-29 ENCOUNTER — Telehealth: Payer: Self-pay | Admitting: Family Medicine

## 2013-07-29 NOTE — Telephone Encounter (Signed)
Called pt several times today she called me back and stated that the dose pak is not working adn wants to take the prednisone 10mg  in stead, i gave her the message and she stated that she did not have the money to come in so I told her if the rrash is not improving she needed to go to urgent care.

## 2013-07-29 NOTE — Telephone Encounter (Signed)
Pt said that the prednisone 4 mg is not working. She has used 10 mg before and wants to know if you can send that in for her for her hives.

## 2013-07-29 NOTE — Telephone Encounter (Signed)
She should have taken as a Medrol dosepak, per the instructions, and it tapers her down to 1 tablet daily She can also take benadryl  Please call and see if she took the as prescribed on the label If rash is not improving she needs to have it evaluated, so she can go to Urgent care if needed, if we do not have any appointments today

## 2013-08-01 ENCOUNTER — Encounter (HOSPITAL_COMMUNITY): Payer: Self-pay

## 2013-08-01 ENCOUNTER — Encounter (HOSPITAL_COMMUNITY)
Admission: RE | Admit: 2013-08-01 | Discharge: 2013-08-01 | Disposition: A | Discharge status: Home / Self Care | Payer: BC Managed Care – PPO | Source: Ambulatory Visit | Attending: Family Medicine | Admitting: Family Medicine

## 2013-08-01 DIAGNOSIS — E0590 Thyrotoxicosis, unspecified without thyrotoxic crisis or storm: Secondary | ICD-10-CM

## 2013-08-01 DIAGNOSIS — E042 Nontoxic multinodular goiter: Secondary | ICD-10-CM

## 2013-08-01 MED ORDER — SODIUM IODIDE I 131 CAPSULE
13.0000 | Freq: Once | INTRAVENOUS | Status: AC | PRN
  Administered 2013-08-01: 13 via ORAL

## 2013-08-02 ENCOUNTER — Encounter (HOSPITAL_COMMUNITY): Payer: Self-pay

## 2013-08-02 ENCOUNTER — Encounter (HOSPITAL_COMMUNITY)
Admission: RE | Admit: 2013-08-02 | Discharge: 2013-08-02 | Disposition: A | Discharge status: Home / Self Care | Payer: BC Managed Care – PPO | Source: Ambulatory Visit | Attending: Family Medicine | Admitting: Family Medicine

## 2013-08-02 DIAGNOSIS — E0590 Thyrotoxicosis, unspecified without thyrotoxic crisis or storm: Secondary | ICD-10-CM | POA: Insufficient documentation

## 2013-08-02 MED ORDER — SODIUM PERTECHNETATE TC 99M INJECTION
10.0000 | Freq: Once | INTRAVENOUS | Status: AC | PRN
  Administered 2013-08-02: 10 via INTRAVENOUS

## 2013-08-03 ENCOUNTER — Other Ambulatory Visit: Payer: Self-pay | Admitting: Family Medicine

## 2013-08-03 DIAGNOSIS — E041 Nontoxic single thyroid nodule: Secondary | ICD-10-CM

## 2013-08-03 DIAGNOSIS — E0590 Thyrotoxicosis, unspecified without thyrotoxic crisis or storm: Secondary | ICD-10-CM

## 2013-08-08 ENCOUNTER — Telehealth: Payer: Self-pay | Admitting: Family Medicine

## 2013-08-08 MED ORDER — PREDNISONE 10 MG PO TABS: ORAL_TABLET | ORAL | Status: DC

## 2013-08-08 NOTE — Telephone Encounter (Signed)
I recommend she take claritin or zyrtec over the counter 1 tablet a day/ take prednisone as prescribed If rash is still present after meds she needs to have an OV or schedule with a dermatologist My condolences to her family

## 2013-08-08 NOTE — Telephone Encounter (Signed)
Pt would like Prednisone 10 mg sent to pharmacy because hives are no better. Her dad passed away last night so she cannot come in right now for ov. Can you send that in for her?

## 2013-08-08 NOTE — Telephone Encounter (Signed)
Pt aware of message

## 2013-09-12 ENCOUNTER — Encounter: Payer: Self-pay | Admitting: *Deleted

## 2013-11-01 ENCOUNTER — Ambulatory Visit: Payer: BC Managed Care – PPO | Admitting: Family Medicine

## 2013-11-04 ENCOUNTER — Ambulatory Visit: Payer: BC Managed Care – PPO | Admitting: Family Medicine

## 2015-02-26 ENCOUNTER — Ambulatory Visit (INDEPENDENT_AMBULATORY_CARE_PROVIDER_SITE_OTHER): Payer: BLUE CROSS/BLUE SHIELD | Admitting: Family Medicine

## 2015-02-26 ENCOUNTER — Encounter: Payer: Self-pay | Admitting: Family Medicine

## 2015-02-26 VITALS — BP 134/62 | HR 76 | Temp 98.1°F | Resp 14 | Ht 64.0 in | Wt 140.0 lb

## 2015-02-26 DIAGNOSIS — E0590 Thyrotoxicosis, unspecified without thyrotoxic crisis or storm: Secondary | ICD-10-CM | POA: Diagnosis not present

## 2015-02-26 DIAGNOSIS — G8929 Other chronic pain: Secondary | ICD-10-CM

## 2015-02-26 DIAGNOSIS — Z0000 Encounter for general adult medical examination without abnormal findings: Secondary | ICD-10-CM | POA: Diagnosis not present

## 2015-02-26 DIAGNOSIS — F4321 Adjustment disorder with depressed mood: Secondary | ICD-10-CM

## 2015-02-26 DIAGNOSIS — R1031 Right lower quadrant pain: Secondary | ICD-10-CM

## 2015-02-26 DIAGNOSIS — E079 Disorder of thyroid, unspecified: Secondary | ICD-10-CM | POA: Diagnosis not present

## 2015-02-26 DIAGNOSIS — E042 Nontoxic multinodular goiter: Secondary | ICD-10-CM

## 2015-02-26 DIAGNOSIS — F4381 Prolonged grief disorder: Secondary | ICD-10-CM

## 2015-02-26 DIAGNOSIS — E559 Vitamin D deficiency, unspecified: Secondary | ICD-10-CM | POA: Diagnosis not present

## 2015-02-26 DIAGNOSIS — F4329 Adjustment disorder with other symptoms: Secondary | ICD-10-CM

## 2015-02-26 DIAGNOSIS — E785 Hyperlipidemia, unspecified: Secondary | ICD-10-CM

## 2015-02-26 NOTE — Patient Instructions (Signed)
CT scan to be done Schedule Mammogram We will call with lab results F/U 1 year

## 2015-02-26 NOTE — Progress Notes (Signed)
Patient ID: Carolyn Raymond, female   DOB: 30-Oct-1956, 59 y.o.   MRN: [id number]   Subjective:    Patient ID: Carolyn SharkKimberly R Raymond, female    DOB: 30-Oct-1956, 59 y.o.   MRN: [id number]  Patient presents for CPE and R Side Pain  Patient here for complete physical exam. She is due for mammogram. Her colonoscopy and Pap smear up-to-date. She is due for fasting labs. She does not take any prescribed medications. She is history of chronic abdominal pain worse on the right side but she's had increasing pain over the past few months. 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 She denies any change with eating and no change in her bowels. She does admit to some fatigue as well still like she has had some muscle weakness and wasting. She has subclinical hyperthyroidism and she was actually referred to endocrinologist but she never went. She has been dealing with the death of her father which is bent over a year ago but she still has a lot of difficulty with this.   Review Of Systems:  GEN- denies fatigue, fever, weight loss,weakness, recent illness HEENT- denies eye drainage, change in vision, nasal discharge, CVS- denies chest pain, palpitations RESP- denies SOB, cough, wheeze ABD- denies N/V, change in stools, abd pain GU- denies dysuria, hematuria, dribbling, incontinence MSK- denies joint pain, muscle aches, injury Neuro- denies headache, dizziness, syncope, seizure activity       Objective:    BP 134/62 mmHg  Pulse 76  Temp(Src) 98.1 F (36.7 C) (Oral)  Resp 14  Ht 5\' 4"  (1.626 m)  Wt 140 lb (63.504 kg)  BMI 24.02 kg/m2 GEN- NAD, alert and oriented x3 HEENT- PERRL, EOMI, non injected sclera, pink conjunctiva, MMM, oropharynx clear Neck- Supple, + goiter, nodule palpated CVS- RRR, no murmur RESP-CTAB ABD-NABS,soft,TTP RUQ and RLQ below incision, no mass palpated,ND Psych- flat affect, not anxious  appearing, tearful discussing father EXT- No edema Pulses- Radial, DP- 2+        Assessment & Plan:      Problem List Items Addressed This Visit      Unprioritized   Vitamin D deficiency   Relevant Orders   Vitamin D, 25-hydroxy   Subclinical hyperthyroidism   Relevant Orders   TSH   T3, free   T4, free   Routine general medical examination at a health care facility   Relevant Orders   CBC with Differential/Platelet   Comprehensive metabolic panel   Hyperlipidemia   Relevant Medications   aspirin 81 MG tablet   Other Relevant Orders   Lipid panel   GOITER, NONTOXIC MULTINODULAR    Other Visit Diagnoses    Thyroid disease    -  Primary       Note: This dictation was prepared with Dragon dictation along with smaller phrase technology. Any transcriptional errors that result from this process are unintentional.

## 2015-02-27 ENCOUNTER — Encounter: Payer: Self-pay | Admitting: Family Medicine

## 2015-02-27 ENCOUNTER — Telehealth: Payer: Self-pay | Admitting: Family Medicine

## 2015-02-27 DIAGNOSIS — F4381 Prolonged grief disorder: Secondary | ICD-10-CM | POA: Insufficient documentation

## 2015-02-27 DIAGNOSIS — F4329 Adjustment disorder with other symptoms: Secondary | ICD-10-CM | POA: Insufficient documentation

## 2015-02-27 LAB — COMPREHENSIVE METABOLIC PANEL
ALT: 10 U/L (ref 0–35)
AST: 16 U/L (ref 0–37)
Albumin: 4 g/dL (ref 3.5–5.2)
Alkaline Phosphatase: 99 U/L (ref 39–117)
BILIRUBIN TOTAL: 0.5 mg/dL (ref 0.2–1.2)
BUN: 12 mg/dL (ref 6–23)
CALCIUM: 9.1 mg/dL (ref 8.4–10.5)
CO2: 24 mEq/L (ref 19–32)
CREATININE: 0.68 mg/dL (ref 0.50–1.10)
Chloride: 104 mEq/L (ref 96–112)
Glucose, Bld: 79 mg/dL (ref 70–99)
Potassium: 3.7 mEq/L (ref 3.5–5.3)
SODIUM: 140 meq/L (ref 135–145)
Total Protein: 7.3 g/dL (ref 6.0–8.3)

## 2015-02-27 LAB — CBC WITH DIFFERENTIAL/PLATELET
BASOS ABS: 0 10*3/uL (ref 0.0–0.1)
BASOS PCT: 0 % (ref 0–1)
EOS PCT: 2 % (ref 0–5)
Eosinophils Absolute: 0.1 10*3/uL (ref 0.0–0.7)
HCT: 39.7 % (ref 36.0–46.0)
Hemoglobin: 13 g/dL (ref 12.0–15.0)
LYMPHS PCT: 47 % — AB (ref 12–46)
Lymphs Abs: 2.4 10*3/uL (ref 0.7–4.0)
MCH: 30 pg (ref 26.0–34.0)
MCHC: 32.7 g/dL (ref 30.0–36.0)
MCV: 91.5 fL (ref 78.0–100.0)
MPV: 10.7 fL (ref 8.6–12.4)
Monocytes Absolute: 0.4 10*3/uL (ref 0.1–1.0)
Monocytes Relative: 7 % (ref 3–12)
NEUTROS ABS: 2.2 10*3/uL (ref 1.7–7.7)
NEUTROS PCT: 44 % (ref 43–77)
PLATELETS: 257 10*3/uL (ref 150–400)
RBC: 4.34 MIL/uL (ref 3.87–5.11)
RDW: 13.8 % (ref 11.5–15.5)
WBC: 5.1 10*3/uL (ref 4.0–10.5)

## 2015-02-27 LAB — T4, FREE: Free T4: 0.85 ng/dL (ref 0.80–1.80)

## 2015-02-27 LAB — LIPID PANEL
CHOL/HDL RATIO: 3.2 ratio
CHOLESTEROL: 230 mg/dL — AB (ref 0–200)
HDL: 73 mg/dL (ref >=46)
LDL CALC: 148 mg/dL — AB (ref 0–99)
TRIGLYCERIDES: 44 mg/dL (ref <150)
VLDL: 9 mg/dL (ref 0–40)

## 2015-02-27 LAB — T3, FREE: T3, Free: 2.8 pg/mL (ref 2.3–4.2)

## 2015-02-27 LAB — VITAMIN D 25 HYDROXY (VIT D DEFICIENCY, FRACTURES): Vit D, 25-Hydroxy: 18 ng/mL — ABNORMAL LOW (ref 30–100)

## 2015-02-27 LAB — TSH: TSH: 0.412 u[IU]/mL (ref 0.350–4.500)

## 2015-02-27 NOTE — Assessment & Plan Note (Signed)
Recheck TFT, she did not follow up with endocrinology , will see if this is still needed, she did have an overactive nodule on her uptake scan back in 2014

## 2015-02-27 NOTE — Assessment & Plan Note (Signed)
Chronic right lower abdominal pain near her scar but it has been increasing of the past couple months. She has significant pain at nighttime. There is no change with her bowels or her appetite. 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 I'm concerned this is most likely some scar tissue getting in the way that we will take a closer look

## 2015-02-27 NOTE — Assessment & Plan Note (Signed)
Recheck cholesterol panel 

## 2015-02-27 NOTE — Assessment & Plan Note (Signed)
She has some prolonged grief however she does have a very supportive family she declines any intervention with medications. She will let me know if she desires any help from my end

## 2015-02-27 NOTE — Assessment & Plan Note (Signed)
Complete physical exam done. Given Information to schedule mammogram otherwise preventative screening is up-to-date, fasting labs done today

## 2015-02-27 NOTE — Telephone Encounter (Signed)
Patient would like to speak with you regarding her ct scan and mammogram [phone]

## 2015-03-01 ENCOUNTER — Other Ambulatory Visit: Payer: Self-pay | Admitting: *Deleted

## 2015-03-01 DIAGNOSIS — E0590 Thyrotoxicosis, unspecified without thyrotoxic crisis or storm: Secondary | ICD-10-CM

## 2015-03-01 DIAGNOSIS — E041 Nontoxic single thyroid nodule: Secondary | ICD-10-CM

## 2015-03-01 NOTE — Telephone Encounter (Signed)
lmtrc

## 2015-03-01 NOTE — Telephone Encounter (Signed)
Pt called back and she had questions about her ct scan and mammogram and I answered them for her.

## 2015-03-07 ENCOUNTER — Other Ambulatory Visit: Payer: Self-pay | Admitting: Family Medicine

## 2015-03-07 ENCOUNTER — Ambulatory Visit (HOSPITAL_COMMUNITY)
Admission: RE | Admit: 2015-03-07 | Discharge: 2015-03-07 | Disposition: A | Discharge status: Home / Self Care | Payer: BLUE CROSS/BLUE SHIELD | Source: Ambulatory Visit | Attending: Family Medicine | Admitting: Family Medicine

## 2015-03-07 DIAGNOSIS — G8929 Other chronic pain: Secondary | ICD-10-CM

## 2015-03-07 DIAGNOSIS — R1031 Right lower quadrant pain: Secondary | ICD-10-CM | POA: Diagnosis present

## 2015-03-12 ENCOUNTER — Telehealth: Payer: Self-pay | Admitting: *Deleted

## 2015-03-12 NOTE — Telephone Encounter (Signed)
Received call from patient.   Reports that she would like to go to Dr. Fransico HimNida in regards to endocrinology referral.   Referral coordinator to be made aware.

## 2015-03-15 NOTE — Telephone Encounter (Signed)
refaxed referral to Dr. Fransico HimNida office

## 2015-04-05 ENCOUNTER — Ambulatory Visit: Payer: BLUE CROSS/BLUE SHIELD | Admitting: Internal Medicine

## 2015-04-19 ENCOUNTER — Other Ambulatory Visit (HOSPITAL_COMMUNITY): Payer: Self-pay | Admitting: "Endocrinology

## 2015-04-19 DIAGNOSIS — E049 Nontoxic goiter, unspecified: Secondary | ICD-10-CM

## 2015-04-23 ENCOUNTER — Ambulatory Visit (HOSPITAL_COMMUNITY)
Admission: RE | Admit: 2015-04-23 | Discharge: 2015-04-23 | Disposition: A | Discharge status: Home / Self Care | Payer: BLUE CROSS/BLUE SHIELD | Source: Ambulatory Visit | Attending: "Endocrinology | Admitting: "Endocrinology

## 2015-04-23 DIAGNOSIS — E049 Nontoxic goiter, unspecified: Secondary | ICD-10-CM | POA: Diagnosis present

## 2015-10-29 ENCOUNTER — Ambulatory Visit (INDEPENDENT_AMBULATORY_CARE_PROVIDER_SITE_OTHER): Payer: BLUE CROSS/BLUE SHIELD | Admitting: "Endocrinology

## 2015-10-29 ENCOUNTER — Encounter: Payer: Self-pay | Admitting: "Endocrinology

## 2015-10-29 VITALS — BP 123/88 | HR 87 | Ht 64.0 in | Wt 144.0 lb

## 2015-10-29 DIAGNOSIS — E042 Nontoxic multinodular goiter: Secondary | ICD-10-CM | POA: Diagnosis not present

## 2015-10-29 NOTE — Progress Notes (Signed)
Subjective:    Patient ID: Carolyn Raymond, female    DOB: August 16, 1956, PCP Milinda Antis, MD   Past Medical History  Diagnosis Date  . Thyroid disease     nodules  . Allergy    Past Surgical History  Procedure Laterality Date  . Cholecystectomy  07/1998  . 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 Marital Status: Married    Spouse Name: N/A  . Number of Children: N/A  . Years of Education: N/A   Social History Main Topics  . Smoking status: Never Smoker   . Smokeless tobacco: Never Used  . Alcohol Use: No  . Drug Use: No  . Sexual Activity: Not Asked   Other Topics Concern  . None   Social History Narrative   Outpatient Encounter Prescriptions as of 10/29/2015  Medication Sig  . Red Yeast Rice Extract (RED YEAST RICE PO) Take by mouth daily.  . Ascorbic Acid (VITAMIN C) 100 MG tablet Take 100 mg by mouth daily.  Marland Kitchen aspirin 81 MG tablet Take 81 mg by mouth daily.  . Garlic 100 MG TABS Take by mouth.  Marland Kitchen VITAMIN A PO Take 1 tablet by mouth. 3 days a week   No facility-administered encounter medications on file as of 10/29/2015.   ALLERGIES: Allergies  Allergen Reactions  . Bee Pollen   . Isovue [Iopamidol]    VACCINATION STATUS: Immunization History  Administered Date(s) Administered  . Td 01/29/2010    HPI  Carolyn Raymond is a 59 - yr-old female with medical hx as above. she is here to f/u for MNG  with repeat thyroid ultrasound . Her hx starts in 2011 where she was found to have MNG with largest nodule less than 10 mm in the right lobe. she has had another u/s in 2013 where her right lobe was 4.6 cms with 2 nodules ( 4mm and 9 mm), and left lobe 4.7 cms with at least 3 nodules ( 9mm, 10mm, and 10mm). Her last thyroid u/s from 2016 is unremarkable, her TFTs from September are WNL. she never required thyroid hormone therapy nor FNA. She denies dysphagia, SOB, nor voice change. she denies exposure to neck  radiation.   Review of Systems Constitutional: no weight gain/loss, no fatigue, no subjective hyperthermia/hypothermia Eyes: no blurry vision, no xerophthalmia ENT: no sore throat, no nodules palpated in throat, no dysphagia/odynophagia, no hoarseness Cardiovascular: no CP/SOB/palpitations/leg swelling Respiratory: no cough/SOB Gastrointestinal: no N/V/D/C Musculoskeletal: no muscle/joint aches Skin: no rashes Neurological: no tremors/numbness/tingling/dizziness Psychiatric: no depression/anxiety  Objective:    BP 123/88 mmHg  Pulse 87  Ht  (1.626 m)  Wt 144 lb (65.318 kg)  BMI 24.71 kg/m2  SpO2 98%  Wt Readings from Last 3 Encounters:  10/29/15 144 lb (65.318 kg)  02/26/15 140 lb (63.504 kg)  07/11/13 142 lb (64.411 kg)    Physical Exam  Constitutional: NAD Eyes: PERRLA, EOMI, no exophthalmos ENT: moist mucous membranes, + thyromegaly, no cervical lymphadenopathy Cardiovascular: RRR, No MRG Respiratory: CTA B Gastrointestinal: abdomen soft, NT, ND, BS+ Musculoskeletal: no deformities, strength intact in all 4 Skin: moist, warm, no rashes Neurological: no tremor with outstretched hands, DTR normal in all 4  Complete Blood Count (Most recent): Lab Results  Component Value Date   WBC 5.1 02/26/2015   HGB 13.0 02/26/2015   HCT 39.7 02/26/2015   MCV 91.5 02/26/2015   PLT 257 02/26/2015   Chemistry (most  recent): Lab Results  Component Value Date   NA 140 02/26/2015   K 3.7 02/26/2015   CL 104 02/26/2015   CO2 24 02/26/2015   BUN 12 02/26/2015   CREATININE 0.68 02/26/2015   Diabetic Labs (most recent): Lab Results  Component Value Date   HGBA1C 6.0* 04/11/2010   Lipid profile (most recent): Lab Results  Component Value Date   TRIG 44 02/26/2015   CHOL 230* 02/26/2015   Heart thyroid function tests from 08/06/2015 showed: TSH 0.73, free T4 0.9, free T3 2 0.9    Assessment & Plan:   1. GOITER, NONTOXIC MULTINODULAR  She has euthyroid MNG  with last thyroid ultrasound in 2016, showing stable multiple thyroid nodules biggest 11 mm . She will  not need intervention for this for now.  Her prior thyroid uptake and scan showed 18.9%, with likely localized increased uptake in inferior pole of the left lobe with decreased uptake in the rest of the gland. -  she will RTN in 12 months with repeat TFTs for reevaluation. -she will have physical exam at that time, if thyroid continues to grow she may need surveillance ultrasound.  - I advised patient to maintain close follow up with Milinda AntisURHAM, KAWANTA, MD for primary care needs. Follow up plan: Return in about 1 year (around 10/28/2016) for nodular goiter.  Marquis LunchGebre Ason Heslin, MD Phone: [phone]  Fax: [phone]   10/29/2015, 11:40 AM

## 2016-06-13 ENCOUNTER — Encounter: Payer: Self-pay | Admitting: Family Medicine

## 2016-06-13 ENCOUNTER — Ambulatory Visit (INDEPENDENT_AMBULATORY_CARE_PROVIDER_SITE_OTHER): Payer: BLUE CROSS/BLUE SHIELD | Admitting: Family Medicine

## 2016-06-13 VITALS — BP 118/64 | HR 68 | Temp 98.8°F | Resp 14 | Ht 64.0 in | Wt 151.0 lb

## 2016-06-13 DIAGNOSIS — L03115 Cellulitis of right lower limb: Secondary | ICD-10-CM

## 2016-06-13 DIAGNOSIS — I8390 Asymptomatic varicose veins of unspecified lower extremity: Secondary | ICD-10-CM

## 2016-06-13 DIAGNOSIS — I868 Varicose veins of other specified sites: Secondary | ICD-10-CM | POA: Diagnosis not present

## 2016-06-13 MED ORDER — SULFAMETHOXAZOLE-TRIMETHOPRIM 800-160 MG PO TABS: 1.0000 | ORAL_TABLET | Freq: Two times a day (BID) | ORAL | 0 refills | Status: DC

## 2016-06-13 NOTE — Patient Instructions (Addendum)
Referral to vein center Take antibiotics Elevate your leg  Take benadryl for itching F/U Schedule physical for after [facility] August

## 2016-06-13 NOTE — Progress Notes (Signed)
Patient ID: Carolyn Raymond, female   DOB: 06/10/1956, 59 y.o.   MRN: [id number]     Subjective:    Patient ID: Carolyn Raymond, female    DOB: [birth year], 60 y.o.   MRN: [id number]  Patient presents for Insect Bite (x3 days- RLE above ankle- area red and sore- reports that there was a small bump on Wed morning) here with a bite to her right lower leg. She states she was at a cookout this weekend she noted as small itchy bumps she began scratching at it then her ankle and in the medial aspect of her foot swelled up and she had a redness spread across which is unchanged. The swelling has gone down some. Her ankle region is very itchy she has been using topical antibiotic cream and using rubbing alcohol she's also been elevating her foot. She denies any pain with ambulation denies any fever or chills She's also concerned about her varicose veins which are worse and especially on her left foot she would like to have these evaluated she denies any pain she does get occasional swelling on and off   Review Of Systems:  GEN- denies fatigue, fever, weight loss,weakness, recent illness HEENT- denies eye drainage, change in vision, nasal discharge, CVS- denies chest pain, palpitations RESP- denies SOB, cough, wheeze ABD- denies N/V, change in stools, abd pain GU- denies dysuria, hematuria, dribbling, incontinence MSK- denies joint pain, muscle aches, injury Neuro- denies headache, dizziness, syncope, seizure activity       Objective:    BP 118/64 (BP Location: Left Arm, Patient Position: Sitting, Cuff Size: Normal)   Pulse 68   Temp 98.8 F (37.1 C) (Oral)   Resp 14   Ht 5\' 4"  (1.626 m)   Wt 151 lb (68.5 kg)   BMI 25.92 kg/m  GEN- NAD, alert and oriented x3 MSK- FROM right ankle  Skin- right lower leg 5x6 cm area of erythema, warmth extends to medial malleous, NT, no open lesion, small area of hypopigmentation ?bite area, mild swelling over area of erythema  EXT- varicose veins  L>r, spider veins  Pulses- Radial, DP- 2+        Assessment & Plan:      Problem List Items Addressed This Visit    None    Visit Diagnoses   None.     Note: This dictation was prepared with Dragon dictation along with smaller phrase technology. Any transcriptional errors that result from this process are unintentional.

## 2016-06-23 ENCOUNTER — Telehealth: Payer: Self-pay | Admitting: Family Medicine

## 2016-06-23 MED ORDER — SULFAMETHOXAZOLE-TRIMETHOPRIM 800-160 MG PO TABS: 1.0000 | ORAL_TABLET | Freq: Two times a day (BID) | ORAL | 0 refills | Status: DC

## 2016-06-23 NOTE — Telephone Encounter (Signed)
Cellulitis to leg has gotten better but still itchy with discoloration.   Now has gotten bitten on neck and is getting same way as leg.  Feels she need more antibiotics.  Please advise?

## 2016-06-23 NOTE — Telephone Encounter (Signed)
I called patient and told to take 3 more days of antibiotic (Rx to pharmacy)  Use OTC Hydrocortisone cream to affected area.  Pt acknowledged understanding.  NTBS if not better.

## 2016-06-23 NOTE — Telephone Encounter (Signed)
Okay to give 3 more days of antibiotics Have her use topical hydrocortisone on the bug bites

## 2016-06-27 ENCOUNTER — Ambulatory Visit (INDEPENDENT_AMBULATORY_CARE_PROVIDER_SITE_OTHER): Payer: BLUE CROSS/BLUE SHIELD | Admitting: Family Medicine

## 2016-06-27 ENCOUNTER — Encounter: Payer: Self-pay | Admitting: Family Medicine

## 2016-06-27 VITALS — BP 110/62 | HR 95 | Temp 98.9°F | Resp 16 | Ht 64.0 in | Wt 150.0 lb

## 2016-06-27 DIAGNOSIS — W57XXXA Bitten or stung by nonvenomous insect and other nonvenomous arthropods, initial encounter: Secondary | ICD-10-CM

## 2016-06-27 DIAGNOSIS — L089 Local infection of the skin and subcutaneous tissue, unspecified: Secondary | ICD-10-CM

## 2016-06-27 DIAGNOSIS — T148 Other injury of unspecified body region: Secondary | ICD-10-CM | POA: Diagnosis not present

## 2016-06-27 LAB — CBC WITH DIFFERENTIAL/PLATELET
BASOS PCT: 1 %
Basophils Absolute: 60 cells/uL (ref 0–200)
Eosinophils Absolute: 420 cells/uL (ref 15–500)
Eosinophils Relative: 7 %
HEMATOCRIT: 38.8 % (ref 35.0–45.0)
Hemoglobin: 12.6 g/dL (ref 12.0–15.0)
Lymphocytes Relative: 27 %
Lymphs Abs: 1620 cells/uL (ref 850–3900)
MCH: 29.6 pg (ref 27.0–33.0)
MCHC: 32.5 g/dL (ref 32.0–36.0)
MCV: 91.1 fL (ref 80.0–100.0)
MONO ABS: 480 {cells}/uL (ref 200–950)
MPV: 10.6 fL (ref 7.5–12.5)
Monocytes Relative: 8 %
Neutro Abs: 3420 cells/uL (ref 1500–7800)
Neutrophils Relative %: 57 %
PLATELETS: 283 10*3/uL (ref 140–400)
RBC: 4.26 MIL/uL (ref 3.80–5.10)
RDW: 13.8 % (ref 11.0–15.0)
WBC: 6 10*3/uL (ref 3.8–10.8)

## 2016-06-27 MED ORDER — METHYLPREDNISOLONE ACETATE 40 MG/ML IJ SUSP: 40.0000 mg | Freq: Once | INTRAMUSCULAR | Status: DC

## 2016-06-27 MED ORDER — DOXYCYCLINE HYCLATE 100 MG PO TABS: 100.0000 mg | ORAL_TABLET | Freq: Two times a day (BID) | ORAL | 0 refills | Status: DC

## 2016-06-27 MED ORDER — METHYLPREDNISOLONE ACETATE 40 MG/ML IJ SUSP
40.0000 mg | Freq: Once | INTRAMUSCULAR | Status: AC
  Administered 2016-06-27: 40 mg via INTRAMUSCULAR

## 2016-06-27 NOTE — Patient Instructions (Signed)
Take new antibitoics Take some type of probiotic to help your stomach  Elevate foot Steroid shot given F/U for Physical exam- come fasting

## 2016-06-27 NOTE — Progress Notes (Signed)
   Subjective:    Patient ID: Carolyn Raymond, female    DOB: 10/09/1956, 60 y.o.   MRN: [id number]  Patient presents for Follow-up (States there is a blister on the other side of the right foot ) and Other (has experienced itching/discoloration around neck) Patient to follow-up cellulitis of right lower extremity. She was seen on July 28 at that time she been bitten by an insect of some sort she had swelling and redness that spread across her leg with increasing work. Upon my evaluation concern for some cellulitis therefore she was started on Bactrim Which cleared the infection. She called back on August 7 stating that cellulitis was better than she was bitten on her neck interestingly committed been infected therefore I extended her Bactrim for 3 more days and also advised to use topical hydrocortisone on a bites.She still has some mild hyperpigmentation but no further itchiness or bite on her neck. She did use some topical Neosporin as well.  2 days ago she felt another bite she actually saw a clear like spider. She had some itching on the lateral S with of her right foot and then a blister popped up as well as redness and swelling she had clear drainage from the blister. She's not had any fever or chills. She emits that they have some tall grass outside the home where she thinks she may be getting these bites from   Review Of Systems:  GEN- denies fatigue, fever, weight loss,weakness, recent illness HEENT- denies eye drainage, change in vision, nasal discharge, CVS- denies chest pain, palpitations RESP- denies SOB, cough, wheeze ABD- denies N/V, change in stools, abd pain MSK- denies joint pain, muscle aches, injury Neuro- denies headache, dizziness, syncope, seizure activity       Objective:    BP 110/62   Pulse 95   Temp 98.9 F (37.2 C)   Resp 16   Ht 5\' 4"  (1.626 m)   Wt 150 lb (68 kg)   SpO2 97%   BMI 25.75 kg/m  GEN- NAD, alert and oriented x3 Neck- Supple, no LAD   CVS- RRR, no murmur RESP-CTAB Skin- no lesions, mild hyperpigamentation previous bite EXT- + erythema and swelling of lateral ankle to base of toes, flat blister noted on lateral aspect Pulses- Radial, DP- 2+        Assessment & Plan:      Problem List Items Addressed This Visit    None    Visit Diagnoses    Insect bite, infected    -  Primary   2nd bite within past 2 weeks,with swelling and blistering, queary spider bite, treat wtih doxycycline,will also give Depo Medrol injection, elevate foot, CBC to be done No area for culture seen  Recommend probiotics having to take 2 courses of antibiotcs back to back   Relevant Orders   CBC with Differential/Platelet      Note: This dictation was prepared with Dragon dictation along with smaller phrase technology. Any transcriptional errors that result from this process are unintentional.

## 2016-07-22 ENCOUNTER — Encounter: Payer: BLUE CROSS/BLUE SHIELD | Admitting: Family Medicine

## 2016-07-30 ENCOUNTER — Ambulatory Visit (INDEPENDENT_AMBULATORY_CARE_PROVIDER_SITE_OTHER): Payer: BLUE CROSS/BLUE SHIELD | Admitting: Family Medicine

## 2016-07-30 ENCOUNTER — Encounter: Payer: Self-pay | Admitting: Family Medicine

## 2016-07-30 VITALS — BP 112/72 | HR 80 | Temp 98.2°F | Resp 16 | Ht 64.0 in | Wt 150.0 lb

## 2016-07-30 DIAGNOSIS — Z23 Encounter for immunization: Secondary | ICD-10-CM | POA: Diagnosis not present

## 2016-07-30 DIAGNOSIS — Z0000 Encounter for general adult medical examination without abnormal findings: Secondary | ICD-10-CM

## 2016-07-30 DIAGNOSIS — Z124 Encounter for screening for malignant neoplasm of cervix: Secondary | ICD-10-CM | POA: Diagnosis not present

## 2016-07-30 DIAGNOSIS — Z1159 Encounter for screening for other viral diseases: Secondary | ICD-10-CM

## 2016-07-30 DIAGNOSIS — Z1239 Encounter for other screening for malignant neoplasm of breast: Secondary | ICD-10-CM | POA: Diagnosis not present

## 2016-07-30 DIAGNOSIS — E785 Hyperlipidemia, unspecified: Secondary | ICD-10-CM | POA: Diagnosis not present

## 2016-07-30 DIAGNOSIS — E559 Vitamin D deficiency, unspecified: Secondary | ICD-10-CM

## 2016-07-30 LAB — CBC WITH DIFFERENTIAL/PLATELET
BASOS ABS: 0 {cells}/uL (ref 0–200)
Basophils Relative: 0 %
EOS ABS: 124 {cells}/uL (ref 15–500)
EOS PCT: 2 %
HCT: 40 % (ref 35.0–45.0)
Hemoglobin: 13 g/dL (ref 12.0–15.0)
LYMPHS PCT: 35 %
Lymphs Abs: 2170 cells/uL (ref 850–3900)
MCH: 30.1 pg (ref 27.0–33.0)
MCHC: 32.5 g/dL (ref 32.0–36.0)
MCV: 92.6 fL (ref 80.0–100.0)
MONOS PCT: 6 %
MPV: 10.3 fL (ref 7.5–12.5)
Monocytes Absolute: 372 cells/uL (ref 200–950)
NEUTROS ABS: 3534 {cells}/uL (ref 1500–7800)
NEUTROS PCT: 57 %
PLATELETS: 297 10*3/uL (ref 140–400)
RBC: 4.32 MIL/uL (ref 3.80–5.10)
RDW: 14.1 % (ref 11.0–15.0)
WBC: 6.2 10*3/uL (ref 3.8–10.8)

## 2016-07-30 LAB — COMPREHENSIVE METABOLIC PANEL
ALT: 9 U/L (ref 6–29)
AST: 16 U/L (ref 10–35)
Albumin: 3.8 g/dL (ref 3.6–5.1)
Alkaline Phosphatase: 105 U/L (ref 33–130)
BUN: 13 mg/dL (ref 7–25)
CHLORIDE: 105 mmol/L (ref 98–110)
CO2: 29 mmol/L (ref 20–31)
Calcium: 9.2 mg/dL (ref 8.6–10.4)
Creat: 0.71 mg/dL (ref 0.50–0.99)
Glucose, Bld: 79 mg/dL (ref 70–99)
POTASSIUM: 3.9 mmol/L (ref 3.5–5.3)
SODIUM: 141 mmol/L (ref 135–146)
TOTAL PROTEIN: 7.6 g/dL (ref 6.1–8.1)
Total Bilirubin: 0.4 mg/dL (ref 0.2–1.2)

## 2016-07-30 LAB — LIPID PANEL
CHOLESTEROL: 267 mg/dL — AB (ref 125–200)
HDL: 86 mg/dL (ref >=46)
LDL CALC: 168 mg/dL — AB (ref <130)
TRIGLYCERIDES: 66 mg/dL (ref <150)
Total CHOL/HDL Ratio: 3.1 Ratio (ref <=5.0)
VLDL: 13 mg/dL (ref <30)

## 2016-07-30 NOTE — Assessment & Plan Note (Signed)
Physical  exam done with Pap smear. She will schedule her mammogram. Flu shot given today she did not want more and one shot therefore will get shingles shot at next visit. We'll check her vitamin D she has history of deficiency. She will follow-up with her endocrinologist regarding her thyroid. fasting labs today   Note she is also scheduled to see vascular for her varicose veins

## 2016-07-30 NOTE — Addendum Note (Signed)
Addended by: Legrand RamsWILLIS, SANDY B on: 07/30/2016 11:35 AM   Modules accepted: Orders

## 2016-07-30 NOTE — Patient Instructions (Signed)
I recommend eye visit once a year I recommend dental visit every 6 months Goal is to  Exercise 30 minutes 5 days a week We will send a letter with lab results  F/U 1 year or as needed  

## 2016-07-30 NOTE — Progress Notes (Signed)
   Subjective:    Patient ID: Carolyn Raymond, female    DOB: [birth year], 60 y.o.   MRN: [id number]  Patient presents for Annual Exam (Pt fasting - needs pap)  Patient for complete physical exam PAP Smear- Due Mammogram- Due Colonoscopy- UTD   Multinodular goiter- followed by endocrine to be seen in Dec with repeat labs and imaging  Medications reviewed, family history reviewed Immunizations- due for shingles vaccine, flu shot  Discussed hepatitis C screening/HIV test   Hyperlipidemia- treated with red yeast rice and diet   Review Of Systems:  GEN- denies fatigue, fever, weight loss,weakness, recent illness HEENT- denies eye drainage, change in vision, nasal discharge, CVS- denies chest pain, palpitations RESP- denies SOB, cough, wheeze ABD- denies N/V, change in stools, abd pain GU- denies dysuria, hematuria, dribbling, incontinence MSK- denies joint pain, muscle aches, injury Neuro- denies headache, dizziness, syncope, seizure activity       Objective:    BP 112/72   Pulse 80   Temp 98.2 F (36.8 C) (Oral)   Resp 16   Ht 5\' 4"  (1.626 m)   Wt 150 lb (68 kg)   BMI 25.75 kg/m  GEN- NAD, alert and oriented x3 HEENT- PERRL, EOMI, non injected sclera, pink conjunctiva, MMM, oropharynx clear Neck- Supple, no thyromegaly Breast- normal symmetry, no nipple inversion,no nipple drainage, no nodules or lumps felt Nodes- no axillary nodes CVS- RRR, no murmur RESP-CTAB ABD-NABS,soft,NT,ND GU- normal external genitalia, vaginal mucosa pink and moist - mild atrophy, cervix visualized no growth, no blood form os, no  discharge, no CMT, no ovarian masses, uterus normal size EXT- No edema,varicose veins  Pulses- Radial, DP- 2+        Assessment & Plan:      Problem List Items Addressed This Visit    Vitamin D deficiency - Primary   Relevant Orders   Vitamin D, 25-hydroxy   Routine general medical examination at a health care facility    Physical  exam done with  Pap smear. She will schedule her mammogram. Flu shot given today she did not want more and one shot therefore will get shingles shot at next visit. We'll check her vitamin D she has history of deficiency. She will follow-up with her endocrinologist regarding her thyroid. fasting labs today   Note she is also scheduled to see vascular for her varicose veins       Relevant Orders   CBC with Differential/Platelet   Comprehensive metabolic panel   Lipid panel   HIV antibody   Hyperlipidemia   Relevant Orders   Lipid panel    Other Visit Diagnoses    Cervical cancer screening       Relevant Orders   PAP, ThinPrep ASCUS Rflx HPV Rflx Type   Breast cancer screening       Relevant Orders   MM DIGITAL SCREENING BILATERAL   Need for hepatitis C screening test       Relevant Orders   Hepatitis C antibody      Note: This dictation was prepared with Dragon dictation along with smaller phrase technology. Any transcriptional errors that result from this process are unintentional.

## 2016-07-31 LAB — HIV ANTIBODY (ROUTINE TESTING W REFLEX): HIV 1&2 Ab, 4th Generation: NONREACTIVE

## 2016-07-31 LAB — PAP THINPREP ASCUS RFLX HPV RFLX TYPE

## 2016-07-31 LAB — HEPATITIS C ANTIBODY: HCV AB: NEGATIVE

## 2016-07-31 LAB — VITAMIN D 25 HYDROXY (VIT D DEFICIENCY, FRACTURES): Vit D, 25-Hydroxy: 20 ng/mL — ABNORMAL LOW (ref 30–100)

## 2016-08-07 ENCOUNTER — Encounter: Payer: Self-pay | Admitting: *Deleted

## 2016-08-13 ENCOUNTER — Ambulatory Visit (INDEPENDENT_AMBULATORY_CARE_PROVIDER_SITE_OTHER): Payer: BLUE CROSS/BLUE SHIELD | Admitting: Family Medicine

## 2016-08-13 ENCOUNTER — Encounter: Payer: Self-pay | Admitting: Family Medicine

## 2016-08-13 VITALS — BP 118/70 | HR 78 | Temp 98.6°F | Resp 14 | Ht 64.0 in | Wt 150.0 lb

## 2016-08-13 DIAGNOSIS — W57XXXA Bitten or stung by nonvenomous insect and other nonvenomous arthropods, initial encounter: Secondary | ICD-10-CM | POA: Diagnosis not present

## 2016-08-13 DIAGNOSIS — S40862A Insect bite (nonvenomous) of left upper arm, initial encounter: Secondary | ICD-10-CM

## 2016-08-13 MED ORDER — TRIAMCINOLONE ACETONIDE 0.1 % EX CREA: 1.0000 "application " | TOPICAL_CREAM | Freq: Two times a day (BID) | CUTANEOUS | 0 refills | Status: DC

## 2016-08-13 MED ORDER — METHYLPREDNISOLONE ACETATE 40 MG/ML IJ SUSP
40.0000 mg | Freq: Once | INTRAMUSCULAR | Status: AC
  Administered 2016-08-13: 40 mg via INTRAMUSCULAR

## 2016-08-13 NOTE — Addendum Note (Signed)
Addended by: Phillips OdorSIX, CHRISTINA H on: 08/13/2016 02:45 PM   Modules accepted: Orders

## 2016-08-13 NOTE — Progress Notes (Signed)
   Subjective:    Patient ID: Carolyn Raymond, female    DOB: [birth year], 60 y.o.   MRN: [id number]  Patient presents for Cellulitis (x1 day- possible bug bite to L outer elbow- states that she noted small bump yesterday- increased itching and redness- diffuse redess and warmth to touch)  A year with insect bite to her left upper arm. Started out as a small bite she is not sure what bit her and then within about 24 hours showed a large red spot on her upper arm that is very itchy. She has used some cortisone which is helpful. He has not had any fever not had any lesions with any discharge. This is similar to what happened on her foot which we did treat for cellulitis as it does not improve in the past and also treated with steroid. Nothing else is new in her regimen.   Review Of Systems:  GEN- denies fatigue, fever, weight loss,weakness, recent illness HEENT- denies eye drainage, change in vision, nasal discharge, CVS- denies chest pain, palpitations RESP- denies SOB, cough, wheeze ABD- denies N/V, change in stools, abd pain GU- denies dysuria, hematuria, dribbling, incontinence MSK- denies joint pain, muscle aches, injury Neuro- denies headache, dizziness, syncope, seizure activity       Objective:    BP 118/70 (BP Location: Right Arm, Patient Position: Sitting, Cuff Size: Normal)   Pulse 78   Temp 98.6 F (37 C) (Oral)   Resp 14   Ht 5\' 4"  (1.626 m)   Wt 150 lb (68 kg)   BMI 25.75 kg/m  GEN- NAD, alert and oriented x3 Skin- Left upper arm- large erythematous circular 12x11cm area mild induration at site of bite bug but no blister no papule msk- Elbow no swelling FROM, forearm/ wrist/hand no swelling/ FROM Pulse- 2+        Assessment & Plan:      Problem List Items Addressed This Visit    None    Visit Diagnoses    Insect bite of left upper arm with local reaction, initial encounter    -  Primary   Based on history I think she may be having localized reactions  to whatever is biting her. Will try Depo Medrol and steroid Cream to site BID, no antibiotics , no systemic symptoms, recheck on Friday       Note: This dictation was prepared with Dragon dictation along with smaller phrase technology. Any transcriptional errors that result from this process are unintentional.

## 2016-08-13 NOTE — Patient Instructions (Addendum)
Steroid Shot given Use Cream  Twice a day  Benadryl at night F/U Friday for recheck- No Copay (May cancel if resolved)

## 2016-08-15 ENCOUNTER — Ambulatory Visit (INDEPENDENT_AMBULATORY_CARE_PROVIDER_SITE_OTHER): Payer: BLUE CROSS/BLUE SHIELD | Admitting: Family Medicine

## 2016-08-15 VITALS — BP 134/74 | HR 84 | Temp 98.5°F | Resp 16

## 2016-08-15 DIAGNOSIS — W57XXXD Bitten or stung by nonvenomous insect and other nonvenomous arthropods, subsequent encounter: Secondary | ICD-10-CM

## 2016-08-15 DIAGNOSIS — S40862D Insect bite (nonvenomous) of left upper arm, subsequent encounter: Secondary | ICD-10-CM | POA: Diagnosis not present

## 2016-08-15 MED ORDER — PREDNISONE 10 MG PO TABS: ORAL_TABLET | ORAL | 0 refills | Status: DC

## 2016-08-15 NOTE — Progress Notes (Signed)
   Subjective:    Patient ID: Carolyn Raymond, female    DOB: June 29, 1956, 60 y.o.   MRN: [id number]  Patient presents for Follow-up Patient here for interim follow-up on her insect bite causing swelling of her left upper arm. She states that it was still red and swollen but yesterday started to go down. She did start scratching at it and thought maybe some of the swelling was going to her elbow. She's not had any fever no chills the cream and the Benadryl has helped.    Review Of Systems:  GEN- denies fatigue, fever, weight loss,weakness, recent illness HEENT- denies eye drainage, change in vision, nasal discharge, CVS- denies chest pain, palpitations RESP- denies SOB, cough, wheeze MSK- denies joint pain, muscle aches, injury Neuro- denies headache, dizziness, syncope, seizure activity       Objective:    BP 134/74   Pulse 84   Temp 98.5 F (36.9 C)   Resp 16  GEN- NAD, alert and oriented x3 Skin- Left upper arm-smaller non erythematous circular 8x6cm area mild induration at site of bite bug but no blister no papule msk- Elbow no swelling FROM, forearm/ wrist/hand no swelling/ FROM Pulse- 2+          Assessment & Plan:      Problem List Items Addressed This Visit    None    Visit Diagnoses    Insect bite of left upper arm with local reaction, subsequent encounter    -  Primary    - No sign of bacterial infection, improved swelling much smaller after Depo Medrol and topical, will give oral prednisone to reducer the rest of inflammation from the reaction.       Note: This dictation was prepared with Dragon dictation along with smaller phrase technology. Any transcriptional errors that result from this process are unintentional.

## 2016-08-15 NOTE — Patient Instructions (Signed)
F/U as needed  Start prednisone

## 2016-08-29 ENCOUNTER — Telehealth: Payer: Self-pay | Admitting: Family Medicine

## 2016-08-29 DIAGNOSIS — L500 Allergic urticaria: Secondary | ICD-10-CM

## 2016-08-29 DIAGNOSIS — Z889 Allergy status to unspecified drugs, medicaments and biological substances status: Secondary | ICD-10-CM

## 2016-08-29 MED ORDER — PREDNISONE 20 MG PO TABS: 20.0000 mg | ORAL_TABLET | Freq: Every day | ORAL | 0 refills | Status: DC

## 2016-08-29 NOTE — Telephone Encounter (Signed)
Patient saying she has another bug bite, dr Jeanice Limdurham has seen her for this, and would like to know if another medication can be called in for this  [phone]

## 2016-08-29 NOTE — Telephone Encounter (Signed)
Call placed to patient to inquire. LMTRC. 

## 2016-08-29 NOTE — Telephone Encounter (Signed)
Patient returned call.  States that she has new area of redness and irritation to L elbow. Reports area is about the size of a golf ball and is extremely itchy.   MD made aware and NO given for prednisone and derm ref.   Orders placed.

## 2016-09-11 ENCOUNTER — Ambulatory Visit (HOSPITAL_COMMUNITY): Payer: BLUE CROSS/BLUE SHIELD

## 2016-09-11 ENCOUNTER — Ambulatory Visit (HOSPITAL_COMMUNITY)
Admission: RE | Admit: 2016-09-11 | Discharge: 2016-09-11 | Disposition: A | Discharge status: Home / Self Care | Payer: BLUE CROSS/BLUE SHIELD | Source: Ambulatory Visit | Attending: Family Medicine | Admitting: Family Medicine

## 2016-09-11 ENCOUNTER — Other Ambulatory Visit: Payer: Self-pay | Admitting: Family Medicine

## 2016-09-11 DIAGNOSIS — Z1231 Encounter for screening mammogram for malignant neoplasm of breast: Secondary | ICD-10-CM | POA: Insufficient documentation

## 2016-09-29 ENCOUNTER — Other Ambulatory Visit: Payer: Self-pay | Admitting: Family Medicine

## 2016-10-07 ENCOUNTER — Ambulatory Visit (INDEPENDENT_AMBULATORY_CARE_PROVIDER_SITE_OTHER): Payer: BLUE CROSS/BLUE SHIELD | Admitting: Family Medicine

## 2016-10-07 ENCOUNTER — Encounter: Payer: Self-pay | Admitting: Family Medicine

## 2016-10-07 VITALS — BP 130/78 | HR 88 | Temp 98.4°F | Resp 14 | Ht 64.0 in | Wt 154.0 lb

## 2016-10-07 DIAGNOSIS — L500 Allergic urticaria: Secondary | ICD-10-CM

## 2016-10-07 MED ORDER — CETIRIZINE HCL 10 MG PO TABS: 10.0000 mg | ORAL_TABLET | Freq: Every day | ORAL | 1 refills | Status: DC

## 2016-10-07 MED ORDER — PREDNISONE 10 MG PO TABS: ORAL_TABLET | ORAL | 0 refills | Status: DC

## 2016-10-07 NOTE — Patient Instructions (Signed)
Dermatology appt for Carolyn Raymond prednisone as prescribed  Raymond the zyrtec  F/U as needed

## 2016-10-07 NOTE — Progress Notes (Signed)
   Subjective:    Patient ID: Carolyn Raymond, female    DOB: 01-08-1956, 60 y.o.   MRN: [id number]  Patient presents for Rash (x1 week- irritation to face, neck and R knee- denies changes to environment or diet) here with rash she's been seen numerous times secondary to insight Band-Aids and local reactions. She denies any insect bite this time however she broke out in hives and initially started on her right side of face and she had some on her left elbow now she has some itchy spots on her right lower leg in her knee. She's been scratching on her knee therefore looks more red. States that she had hives in the past was told that it was her nerves but then states that she was on prednisone for about 2 months. We can refer her to dermatology after she called in with a breakout last month however he she missed this appointment she states that she was not notified of it. She has taken Benadryl last night which helps some with itching Denies any new food or topical exposures  She admits that she has been stressed recently she has adult children who are all required help at this time. Her oldest son also lives with her. She is also trying to go back to work to help with finances in the home  Review Of Systems:  GEN- denies fatigue, fever, weight loss,weakness, recent illness HEENT- denies eye drainage, change in vision, nasal discharge, CVS- denies chest pain, palpitations RESP- denies SOB, cough, wheeze ABD- denies N/V, change in stools, abd pain GU- denies dysuria, hematuria, dribbling, incontinence MSK- denies joint pain, muscle aches, injury Neuro- denies headache, dizziness, syncope, seizure activity       Objective:    BP 130/78 (BP Location: Left Arm, Patient Position: Sitting, Cuff Size: Normal)   Pulse 88   Temp 98.4 F (36.9 C) (Oral)   Resp 14   Ht 5\' 4"  (1.626 m)   Wt 154 lb (69.9 kg)   SpO2 98%   BMI 26.43 kg/m  GEN- NAD, alert and oriented x3 HEENT- PERRL, EOMI, non  injected sclera, pink conjunctiva, MMM, oropharynx clear Neck- Supple, no LAD  CVS- RRR, no murmur RESP-CTAB Skin- erythematous dime size swelling right cheek, hives left forearm, erythema right Leg, +exocoriations no lesions palms soles  Psych- normal affect and mood  EXT- No edema Pulses- Radial  2+        Assessment & Plan:      Problem List Items Addressed This Visit    None    Visit Diagnoses    Allergic urticaria    -  Primary   She continues to have multiple breakouts unclear cause. She needs dermatology appointment in allergy testing. Today of given a longer taper of prednisone and zyrtec. She cannot afford to go to the dermatologist within the next couple weeks therefore this will be pushed back       Note: This dictation was prepared with Dragon dictation along with smaller phrase technology. Any transcriptional errors that result from this process are unintentional.

## 2016-10-29 ENCOUNTER — Ambulatory Visit: Payer: BLUE CROSS/BLUE SHIELD | Admitting: "Endocrinology

## 2017-03-12 ENCOUNTER — Encounter: Payer: Self-pay | Admitting: Family Medicine

## 2017-05-27 ENCOUNTER — Encounter: Payer: Self-pay | Admitting: Family Medicine

## 2017-07-21 ENCOUNTER — Encounter: Payer: Self-pay | Admitting: Family Medicine

## 2017-07-22 ENCOUNTER — Encounter: Payer: Self-pay | Admitting: Family Medicine

## 2017-09-21 ENCOUNTER — Encounter: Payer: Self-pay | Admitting: Family Medicine

## 2018-05-19 ENCOUNTER — Encounter: Payer: Self-pay | Admitting: Internal Medicine

## 2020-04-03 ENCOUNTER — Ambulatory Visit: Payer: BLUE CROSS/BLUE SHIELD | Attending: Internal Medicine

## 2020-04-03 DIAGNOSIS — Z23 Encounter for immunization: Secondary | ICD-10-CM

## 2020-04-03 NOTE — Progress Notes (Signed)
   Covid-19 Vaccination Clinic  Name:  Carolyn Raymond    MRN: [id number] DOB: 1956-03-18  04/03/2020  Carolyn Raymond was observed post Covid-19 immunization for 15 minutes without incident. She was provided with Vaccine Information Sheet and instruction to access the V-Safe system.   Carolyn Raymond was instructed to call 911 with any severe reactions post vaccine: Marland Kitchen Difficulty breathing  . Swelling of face and throat  . A fast heartbeat  . A bad rash all over body  . Dizziness and weakness   Immunizations Administered    Name Date Dose VIS Date Route   Moderna COVID-19 Vaccine 04/03/2020 11:11 AM 0.5 mL 10/2019 Intramuscular   Manufacturer: Moderna   Lot: [id number]   NDC: 33174-099-27

## 2020-05-03 ENCOUNTER — Ambulatory Visit: Payer: BLUE CROSS/BLUE SHIELD | Attending: Internal Medicine

## 2020-05-03 DIAGNOSIS — Z23 Encounter for immunization: Secondary | ICD-10-CM

## 2020-05-03 NOTE — Progress Notes (Signed)
   Covid-19 Vaccination Clinic  Name:  Carolyn Raymond    MRN: [id number] DOB: 1956/11/16  05/03/2020  [name] was observed post Covid-19 immunization for 15 minutes without incident. She was provided with Vaccine Information Sheet and instruction to access the V-Safe system.   Ms. Beers was instructed to call 911 with any severe reactions post vaccine: Marland Kitchen Difficulty breathing  . Swelling of face and throat  . A fast heartbeat  . A bad rash all over body  . Dizziness and weakness   Immunizations Administered    Name Date Dose VIS Date Route   Moderna COVID-19 Vaccine 05/03/2020 12:21 PM 0.5 mL 10/2019 Intramuscular   Manufacturer: Moderna   Lot: [id number]   NDC: 88301-415-97

## 2020-05-26 ENCOUNTER — Encounter: Payer: Self-pay | Admitting: Emergency Medicine

## 2020-05-26 ENCOUNTER — Ambulatory Visit
Admission: EM | Admit: 2020-05-26 | Discharge: 2020-05-26 | Disposition: A | Discharge status: Home / Self Care | Payer: BLUE CROSS/BLUE SHIELD | Attending: Emergency Medicine | Admitting: Emergency Medicine

## 2020-05-26 ENCOUNTER — Other Ambulatory Visit: Payer: Self-pay

## 2020-05-26 DIAGNOSIS — M545 Low back pain, unspecified: Secondary | ICD-10-CM

## 2020-05-26 DIAGNOSIS — S46811A Strain of other muscles, fascia and tendons at shoulder and upper arm level, right arm, initial encounter: Secondary | ICD-10-CM

## 2020-05-26 MED ORDER — NAPROXEN 375 MG PO TABS: 375.0000 mg | ORAL_TABLET | Freq: Two times a day (BID) | ORAL | 0 refills | Status: DC

## 2020-05-26 MED ORDER — CYCLOBENZAPRINE HCL 10 MG PO TABS: 10.0000 mg | ORAL_TABLET | Freq: Every day | ORAL | 0 refills | Status: DC
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
## 2020-05-26 NOTE — Discharge Instructions (Signed)
Rest, ice and heat as needed Ensure adequate range of motion as tolerated. Injuries all appear to be muscular in nature at this time Prescribed naproxen as needed for inflammation and pain relief.  DO NOT TAKE WITH OTHER antiinflammatories, as this may cause GI upset and/or bleed Prescribed flexeril as needed at bedtime for muscle spasm.  Do not drive or operate heavy machinery while taking this medication Expect some increased pain in the next 1-3 days.  It may take 3-4 weeks for complete resolution of symptoms Will f/u with her doctor or here if not seeing significant improvement within one week. Return here or go to ER if you have any new or worsening symptoms such as numbness/tingling of the inner thighs, loss of bladder or bowel control, headache/blurry vision, nausea/vomiting, confusion/altered mental status, dizziness, weakness, passing out, imbalance, etc... 
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
## 2020-11-20 DIAGNOSIS — I83813 Varicose veins of bilateral lower extremities with pain: Secondary | ICD-10-CM | POA: Diagnosis not present

## 2020-11-27 DIAGNOSIS — I83813 Varicose veins of bilateral lower extremities with pain: Secondary | ICD-10-CM | POA: Diagnosis not present

## 2020-11-27 DIAGNOSIS — I8312 Varicose veins of left lower extremity with inflammation: Secondary | ICD-10-CM | POA: Diagnosis not present

## 2020-11-27 DIAGNOSIS — I8311 Varicose veins of right lower extremity with inflammation: Secondary | ICD-10-CM | POA: Diagnosis not present

## 2020-12-19 DIAGNOSIS — I8312 Varicose veins of left lower extremity with inflammation: Secondary | ICD-10-CM | POA: Diagnosis not present

## 2020-12-19 DIAGNOSIS — I83812 Varicose veins of left lower extremities with pain: Secondary | ICD-10-CM | POA: Diagnosis not present

## 2020-12-19 DIAGNOSIS — I83892 Varicose veins of left lower extremities with other complications: Secondary | ICD-10-CM | POA: Diagnosis not present

## 2020-12-25 DIAGNOSIS — I8312 Varicose veins of left lower extremity with inflammation: Secondary | ICD-10-CM | POA: Diagnosis not present

## 2021-01-09 DIAGNOSIS — I8312 Varicose veins of left lower extremity with inflammation: Secondary | ICD-10-CM | POA: Diagnosis not present

## 2021-01-16 ENCOUNTER — Other Ambulatory Visit: Payer: Self-pay

## 2021-01-16 ENCOUNTER — Ambulatory Visit
Admission: EM | Admit: 2021-01-16 | Discharge: 2021-01-16 | Disposition: A | Discharge status: Home / Self Care | Payer: Medicare HMO | Attending: Emergency Medicine | Admitting: Emergency Medicine

## 2021-01-16 ENCOUNTER — Encounter: Payer: Self-pay | Admitting: Emergency Medicine

## 2021-01-16 DIAGNOSIS — W57XXXA Bitten or stung by nonvenomous insect and other nonvenomous arthropods, initial encounter: Secondary | ICD-10-CM | POA: Diagnosis not present

## 2021-01-16 DIAGNOSIS — S40861A Insect bite (nonvenomous) of right upper arm, initial encounter: Secondary | ICD-10-CM

## 2021-01-16 DIAGNOSIS — L239 Allergic contact dermatitis, unspecified cause: Secondary | ICD-10-CM

## 2021-01-16 MED ORDER — DEXAMETHASONE SODIUM PHOSPHATE 10 MG/ML IJ SOLN
10.0000 mg | Freq: Once | INTRAMUSCULAR | Status: AC
  Administered 2021-01-16: 10 mg via INTRAMUSCULAR

## 2021-01-16 MED ORDER — CETIRIZINE HCL 10 MG PO TABS: 10.0000 mg | ORAL_TABLET | Freq: Every day | ORAL | 0 refills | Status: DC

## 2021-01-16 MED ORDER — PREDNISONE 10 MG PO TABS: 20.0000 mg | ORAL_TABLET | Freq: Every day | ORAL | 0 refills | Status: AC

## 2021-01-16 NOTE — Discharge Instructions (Signed)
Prescribed zyrtec andprednisone Take as prescribed and to completion Limit hot shower and baths, or bathe with warm water.   Moisturize skin daily Follow up with PCP if symptoms persists Return or go to the ER if you have any new or worsening symptoms 

## 2021-01-16 NOTE — ED Triage Notes (Signed)
Thinks something bit her on the back of her right arm.  Red area noted to back of arm.  Patient states the area itches.

## 2021-01-16 NOTE — ED Provider Notes (Signed)
[facility]   [id number] 01/16/21 Arrival Time: 1437   Chief Complaint  Patient presents with  . Insect Bite    SUBJECTIVE: History from: patient.  Carolyn Raymond is a 65 y.o. female who presents to the urgent care with a complaint of insect bite to posterior right arm for the past 1 day.  Denies a precipitating event, noticed while changing clothes.  Localizes the bite to her posterior right arm.  She states it is red and itchy.  Has not tried any OTC medication.  Denies previous hx of insect bite.  Denies fever, chills, nausea, vomiting, headache, dizziness, weakness, fatigue, rash, or abdominal pain.   ROS: As per HPI.  All other pertinent ROS negative.      Past Medical History:  Diagnosis Date  . Allergy   . Thyroid disease    nodules   Past Surgical History:  Procedure Laterality Date  . CHOLECYSTECTOMY  07/1998  . 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 Bee Pollen   . Isovue [Iopamidol]    No current facility-administered medications on file prior to encounter.   Current Outpatient Medications on File Prior to Encounter  Medication Sig Dispense Refill  . Ascorbic Acid (VITAMIN C) 100 MG tablet Take 100 mg by mouth daily.    Marland Kitchen aspirin 81 MG tablet Take 81 mg by mouth daily.    . cyclobenzaprine (FLEXERIL) 10 MG tablet Take 1 tablet (10 mg total) by mouth at bedtime. 15 tablet 0  . Garlic 100 MG TABS Take by mouth.    . naproxen (NAPROSYN) 375 MG tablet Take 1 tablet (375 mg total) by mouth 2 (two) times daily. 20 tablet 0  . Red Yeast Rice Extract (RED YEAST RICE PO) Take by mouth daily.    Marland Kitchen triamcinolone cream (KENALOG) 0.1 % Apply 1 application topically 2 (two) times daily. 30 g 0  . VITAMIN A PO Take 1 tablet by mouth. 3 days a week     Social History   Socioeconomic History  . Marital status: Married    Spouse name: Not on file  . Number of children: Not on file  . Years of education: Not on file  .  Highest education level: Not on file  Occupational History  . Not on file  Tobacco Use  . Smoking status: Never Smoker  . Smokeless tobacco: Never Used  Substance and Sexual Activity  . Alcohol use: No  . Drug use: No  . Sexual activity: Not on file  Other Topics Concern  . Not on file  Social History Narrative  . Not on file   Social Determinants of Health   Financial Resource Strain: Not on file  Food Insecurity: Not on file  Transportation Needs: Not on file  Physical Activity: Not on file  Stress: Not on file  Social Connections: Not on file  Intimate Partner Violence: Not on file   History reviewed. No pertinent family history.  OBJECTIVE:  Vitals:   01/16/21 1443  BP: 124/84  Pulse: 87  Resp: 18  Temp: 98.4 F (36.9 C)  TempSrc: Oral  SpO2: 95%    Physical Exam Vitals and nursing note reviewed.  Constitutional:      General: She is not in acute distress.    Appearance: Normal appearance. She is normal weight. She is not ill-appearing, toxic-appearing or diaphoretic.  HENT:     Head: Normocephalic.  Cardiovascular:     Rate and  Rhythm: Normal rate and regular rhythm.     Pulses: Normal pulses.     Heart sounds: Normal heart sounds. No murmur heard. No friction rub. No gallop.   Pulmonary:     Effort: Pulmonary effort is normal. No respiratory distress.     Breath sounds: Normal breath sounds. No stridor. No wheezing, rhonchi or rales.  Chest:     Chest wall: No tenderness.  Skin:    Findings: Rash present. Rash is macular.     Comments: Macular rash on posterior right upper arm  Neurological:     Mental Status: She is alert and oriented to person, place, and time.      LABS:  No results found for this or any previous visit (from the past 24 hour(s)).   ASSESSMENT & PLAN:  1. Insect bite of right upper arm, initial encounter   2. Allergic dermatitis     Meds ordered this encounter  Medications  . dexamethasone (DECADRON) injection 10 mg   . predniSONE (DELTASONE) 10 MG tablet    Sig: Take 2 tablets (20 mg total) by mouth daily for 5 days.    Dispense:  10 tablet    Refill:  0  . cetirizine (ZYRTEC ALLERGY) 10 MG tablet    Sig: Take 1 tablet (10 mg total) by mouth daily.    Dispense:  30 tablet    Refill:  0    Discharge instructions  Prescribed zyrtec and prednisone Take as prescribed and to completion Limit hot shower and baths, or bathe with warm water.   Moisturize skin daily Follow up with PCP if symptoms persists Return or go to the ER if you have any new or worsening symptoms  Reviewed expectations re: course of current medical issues. Questions answered. Outlined signs and symptoms indicating need for more acute intervention. Patient verbalized understanding. After Visit Summary given.         Durward Parcel, FNP 01/16/21 [phone]

## 2021-01-23 DIAGNOSIS — I8311 Varicose veins of right lower extremity with inflammation: Secondary | ICD-10-CM | POA: Diagnosis not present

## 2021-02-06 DIAGNOSIS — I8311 Varicose veins of right lower extremity with inflammation: Secondary | ICD-10-CM | POA: Diagnosis not present

## 2021-02-20 DIAGNOSIS — I8312 Varicose veins of left lower extremity with inflammation: Secondary | ICD-10-CM | POA: Diagnosis not present

## 2021-02-20 DIAGNOSIS — M7981 Nontraumatic hematoma of soft tissue: Secondary | ICD-10-CM | POA: Diagnosis not present

## 2021-03-14 DIAGNOSIS — I8311 Varicose veins of right lower extremity with inflammation: Secondary | ICD-10-CM | POA: Diagnosis not present

## 2021-04-02 DIAGNOSIS — I83892 Varicose veins of left lower extremities with other complications: Secondary | ICD-10-CM | POA: Diagnosis not present

## 2021-04-24 DIAGNOSIS — I8311 Varicose veins of right lower extremity with inflammation: Secondary | ICD-10-CM | POA: Diagnosis not present

## 2021-05-27 DIAGNOSIS — Z823 Family history of stroke: Secondary | ICD-10-CM | POA: Diagnosis not present

## 2021-05-27 DIAGNOSIS — Z809 Family history of malignant neoplasm, unspecified: Secondary | ICD-10-CM | POA: Diagnosis not present

## 2021-05-27 DIAGNOSIS — Z8249 Family history of ischemic heart disease and other diseases of the circulatory system: Secondary | ICD-10-CM | POA: Diagnosis not present

## 2021-05-27 DIAGNOSIS — R030 Elevated blood-pressure reading, without diagnosis of hypertension: Secondary | ICD-10-CM | POA: Diagnosis not present

## 2021-05-27 DIAGNOSIS — Z91041 Radiographic dye allergy status: Secondary | ICD-10-CM | POA: Diagnosis not present

## 2021-05-27 DIAGNOSIS — Z833 Family history of diabetes mellitus: Secondary | ICD-10-CM | POA: Diagnosis not present

## 2021-08-07 ENCOUNTER — Ambulatory Visit
Admission: EM | Admit: 2021-08-07 | Discharge: 2021-08-07 | Disposition: A | Discharge status: Home / Self Care | Payer: Medicare HMO

## 2021-08-07 ENCOUNTER — Encounter: Payer: Self-pay | Admitting: Emergency Medicine

## 2021-08-07 ENCOUNTER — Other Ambulatory Visit: Payer: Self-pay

## 2021-08-07 DIAGNOSIS — W57XXXA Bitten or stung by nonvenomous insect and other nonvenomous arthropods, initial encounter: Secondary | ICD-10-CM

## 2021-08-07 DIAGNOSIS — S80861A Insect bite (nonvenomous), right lower leg, initial encounter: Secondary | ICD-10-CM

## 2021-08-07 NOTE — Discharge Instructions (Signed)
Apply topical hydrocortisone ointment.  Return if any problems

## 2021-08-07 NOTE — ED Triage Notes (Signed)
Possible insect bite to RT inner leg since Thursday that is red and itchy.

## 2021-08-07 NOTE — ED Provider Notes (Signed)
[facility]    CSN: [id number] Arrival date & time: 08/07/21  1459      History   Chief Complaint No chief complaint on file.   HPI Carolyn Raymond is a 65 y.o. female.   Pt reports she was bitten by something while visiting [place].  Pt complains of a discolored area right lower leg. Pt reports itching nd soreness  The history is provided by the patient. No language interpreter was used.  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      Home Medications    Prior to Admission medications   Medication Sig Start Date End Date Taking? Authorizing Provider  Ascorbic Acid (VITAMIN C) 100 MG tablet Take 100 mg by mouth daily.    [provider]  aspirin 81 MG tablet Take 81 mg by mouth daily.    [provider]  cetirizine (ZYRTEC ALLERGY) 10 MG tablet Take 1 tablet (10 mg total) by mouth daily. 01/16/21   Avegno, Zachery Dakins, FNP  cyclobenzaprine (FLEXERIL) 10 MG tablet Take 1 tablet (10 mg total) by mouth at bedtime. 05/26/20   Wurst, [place], PA-C  Garlic 100 MG TABS Take by mouth.    [provider]  naproxen (NAPROSYN)  375 MG tablet Take 1 tablet (375 mg total) by mouth 2 (two) times daily. 05/26/20   Wurst, [place], PA-C  Red Yeast Rice Extract (RED YEAST RICE PO) Take by mouth daily.    [provider]  triamcinolone cream (KENALOG) 0.1 % Apply 1 application topically 2 (two) times daily. 08/13/16   Salley Scarlet, MD  VITAMIN A PO Take 1 tablet by mouth. 3 days a week    [provider]    Family History No family history on file.  Social History Social History   Tobacco Use   Smoking status: Never   Smokeless tobacco: Never  Substance Use Topics   Alcohol use: No   Drug use: No     Allergies   Bee pollen and Isovue [iopamidol]   Review of Systems Review of Systems  All other systems reviewed and are negative.   Physical Exam Triage Vital Signs ED Triage Vitals [08/07/21 1534]  Enc Vitals Group     BP 137/82     Pulse Rate 72     Resp 18     Temp 98.8 F (37.1 C)     Temp Source Oral     SpO2 97 %     Weight      Height      Head Circumference  Peak Flow      Pain Score 0     Pain Loc      Pain Edu?      Excl. in GC?    No data found.  Updated Vital Signs BP 137/82 (BP Location: Right Arm)   Pulse 72   Temp 98.8 F (37.1 C) (Oral)   Resp 18   SpO2 97%   Visual Acuity Right Eye Distance:   Left Eye Distance:   Bilateral Distance:    Right Eye Near:   Left Eye Near:    Bilateral Near:     Physical Exam Vitals reviewed.  Constitutional:      Appearance: Normal appearance.  Musculoskeletal:        General: Swelling and tenderness present.     Comments: 1cm round area right lower leg, slight erythema, no sign of infection,    Neurological:     General: No focal deficit present.     Mental Status: She is alert.  Psychiatric:        Mood and Affect: Mood normal.     UC Treatments / Results  Labs (all labs ordered are listed, but only abnormal results are displayed) Labs Reviewed - No data to display  EKG   Radiology No  results found.  Procedures Procedures (including critical care time)  Medications Ordered in UC Medications - No data to display  Initial Impression / Assessment and Plan / UC Course  I have reviewed the triage vital signs and the nursing notes.  Pertinent labs & imaging results that were available during my care of the patient were reviewed by me and considered in my medical decision making (see chart for details).     MDM;  no sign of infection  Final Clinical Impressions(s) / UC Diagnoses   Final diagnoses:  Insect bite of right lower extremity, initial encounter     Discharge Instructions      Apply topical hydrocortisone ointment.  Return if any problems    ED Prescriptions   None    PDMP not reviewed this encounter.   Elson Areas, [place] 08/07/21 1619

## 2021-08-15 DIAGNOSIS — I8312 Varicose veins of left lower extremity with inflammation: Secondary | ICD-10-CM | POA: Diagnosis not present

## 2021-08-15 DIAGNOSIS — R600 Localized edema: Secondary | ICD-10-CM | POA: Diagnosis not present

## 2021-08-15 DIAGNOSIS — M7981 Nontraumatic hematoma of soft tissue: Secondary | ICD-10-CM | POA: Diagnosis not present

## 2021-08-15 DIAGNOSIS — I8311 Varicose veins of right lower extremity with inflammation: Secondary | ICD-10-CM | POA: Diagnosis not present

## 2021-08-28 DIAGNOSIS — I8312 Varicose veins of left lower extremity with inflammation: Secondary | ICD-10-CM | POA: Diagnosis not present

## 2021-10-21 ENCOUNTER — Other Ambulatory Visit: Payer: Self-pay

## 2021-10-21 ENCOUNTER — Ambulatory Visit
Admission: EM | Admit: 2021-10-21 | Discharge: 2021-10-21 | Disposition: A | Discharge status: Home / Self Care | Payer: Medicare HMO | Attending: Physician Assistant | Admitting: Physician Assistant

## 2021-10-21 ENCOUNTER — Encounter: Payer: Self-pay | Admitting: Emergency Medicine

## 2021-10-21 DIAGNOSIS — R112 Nausea with vomiting, unspecified: Secondary | ICD-10-CM

## 2021-10-21 DIAGNOSIS — R059 Cough, unspecified: Secondary | ICD-10-CM | POA: Diagnosis not present

## 2021-10-21 MED ORDER — ONDANSETRON 4 MG PO TBDP: 4.0000 mg | ORAL_TABLET | Freq: Three times a day (TID) | ORAL | Status: DC | PRN

## 2021-10-21 MED ORDER — ONDANSETRON 4 MG PO TBDP: 4.0000 mg | ORAL_TABLET | Freq: Three times a day (TID) | ORAL | 0 refills | Status: DC | PRN

## 2021-10-21 NOTE — ED Triage Notes (Signed)
Pt reports fever, cough, emesis, sneezing,runny nose x1 week.

## 2021-10-21 NOTE — Discharge Instructions (Addendum)
Return if any problems. Covid and Flu test are pending

## 2021-10-22 LAB — COVID-19, FLU A+B NAA
Influenza A, NAA: NOT DETECTED
Influenza B, NAA: NOT DETECTED
SARS-CoV-2, NAA: NOT DETECTED

## 2021-10-26 NOTE — ED Provider Notes (Signed)
[facility]    CSN: [id number] Arrival date & time: 10/21/21  1143      History   Chief Complaint Chief Complaint  Patient presents with   Fever    HPI Carolyn Raymond is a 65 y.o. female.   The history is provided by the patient. No language interpreter was used.  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    OB History   No obstetric history on file.      Home Medications    Prior to Admission medications   Medication Sig Start Date End Date Taking? Authorizing Provider  ondansetron (ZOFRAN-ODT) 4 MG disintegrating tablet Take 1 tablet (4 mg total) by mouth every 8 (eight) hours as needed for nausea or vomiting. 10/21/21  Yes Cheron Schaumann K, PA-C  Ascorbic Acid (VITAMIN C) 100 MG tablet Take 100 mg by mouth daily.    [provider]  aspirin 81 MG tablet Take 81 mg by mouth daily. Patient not taking: Reported on 10/21/2021    [provider]  cetirizine (ZYRTEC ALLERGY) 10 MG tablet Take 1 tablet (10 mg total) by mouth  daily. Patient not taking: Reported on 10/21/2021 01/16/21   Durward Parcel, FNP  cyclobenzaprine (FLEXERIL) 10 MG tablet Take 1 tablet (10 mg total) by mouth at bedtime. Patient not taking: Reported on 10/21/2021 05/26/20   Wurst, [place], PA-C  Garlic 100 MG TABS Take by mouth.    [provider]  naproxen (NAPROSYN) 375 MG tablet Take 1 tablet (375 mg total) by mouth 2 (two) times daily. Patient not taking: Reported on 10/21/2021 05/26/20   Wurst, [place], PA-C  Red Yeast Rice Extract (RED YEAST RICE PO) Take by mouth daily. Patient not taking: Reported on 10/21/2021    [provider]  triamcinolone cream (KENALOG) 0.1 % Apply 1 application topically 2 (two) times daily. Patient not taking: Reported on 10/21/2021 08/13/16   Salley Scarlet, MD  VITAMIN A PO Take 1 tablet by mouth. 3 days a week    [provider]    Family History History reviewed. No pertinent family history.  Social History Social History   Tobacco Use   Smoking status: Never   Smokeless tobacco: Never  Substance Use Topics   Alcohol use: No   Drug use: No     Allergies   Bee pollen and Isovue [iopamidol]   Review of Systems Review of Systems  Constitutional:  Positive for fever.  All other systems reviewed and are negative.   Physical Exam Triage Vital  Signs ED Triage Vitals [10/21/21 1218]  Enc Vitals Group     BP (!) 155/92     Pulse Rate 81     Resp 18     Temp 98.8 F (37.1 C)     Temp Source Oral     SpO2 96 %     Weight 150 lb (68 kg)     Height 5' 4.5" (1.638 m)     Head Circumference      Peak Flow      Pain Score 6     Pain Loc      Pain Edu?      Excl. in GC?    No data found.  Updated Vital Signs BP (!) 155/92 (BP Location: Right Arm)   Pulse 81   Temp 98.8 F (37.1 C) (Oral)   Resp 18   Ht 5' 4.5" (1.638 m)   Wt 68 kg   SpO2 96%   BMI 25.35 kg/m   Visual Acuity Right Eye Distance:   Left Eye Distance:   Bilateral Distance:     Right Eye Near:   Left Eye Near:    Bilateral Near:     Physical Exam Vitals and nursing note reviewed.  Constitutional:      Appearance: She is well-developed.  HENT:     Head: Normocephalic.  Cardiovascular:     Rate and Rhythm: Normal rate.  Pulmonary:     Effort: Pulmonary effort is normal.  Abdominal:     General: There is no distension.  Musculoskeletal:        General: Normal range of motion.     Cervical back: Normal range of motion.  Neurological:     Mental Status: She is alert and oriented to person, place, and time.     UC Treatments / Results  Labs (all labs ordered are listed, but only abnormal results are displayed) Labs Reviewed  COVID-19, FLU A+B NAA   Narrative:    Performed at:  [address] [place], [place]  [id number] Lab Director: Jolene Schimke MD, Phone:  [phone]    EKG   Radiology No results found.  Procedures Procedures (including critical care time)  Medications Ordered in UC Medications  ondansetron (ZOFRAN-ODT) disintegrating tablet 4 mg (has no administration in time range)    Initial Impression / Assessment and Plan / UC Course  I have reviewed the triage vital signs and the nursing notes.  Pertinent labs & imaging results that were available during my care of the patient were reviewed by me and considered in my medical decision making (see chart for details).      Final Clinical Impressions(s) / UC Diagnoses   Final diagnoses:  Cough, unspecified type  Nausea and vomiting, unspecified vomiting type     Discharge Instructions      Return if any problems. Covid and Flu test are pending   ED Prescriptions     Medication Sig Dispense Auth. Provider   ondansetron (ZOFRAN-ODT) 4 MG disintegrating tablet Take 1 tablet (4 mg total) by mouth every 8 (eight) hours as needed for nausea or vomiting. 20 tablet Elson Areas, [place]      PDMP not reviewed this encounter.   Elson Areas, [place] 10/26/21 1715

## 2022-02-26 DIAGNOSIS — Z809 Family history of malignant neoplasm, unspecified: Secondary | ICD-10-CM | POA: Diagnosis not present

## 2022-02-26 DIAGNOSIS — Z008 Encounter for other general examination: Secondary | ICD-10-CM | POA: Diagnosis not present

## 2022-02-26 DIAGNOSIS — Z823 Family history of stroke: Secondary | ICD-10-CM | POA: Diagnosis not present

## 2022-02-26 DIAGNOSIS — R030 Elevated blood-pressure reading, without diagnosis of hypertension: Secondary | ICD-10-CM | POA: Diagnosis not present

## 2022-02-26 DIAGNOSIS — Z833 Family history of diabetes mellitus: Secondary | ICD-10-CM | POA: Diagnosis not present

## 2022-02-26 DIAGNOSIS — Z8249 Family history of ischemic heart disease and other diseases of the circulatory system: Secondary | ICD-10-CM | POA: Diagnosis not present

## 2022-03-12 ENCOUNTER — Emergency Department (HOSPITAL_COMMUNITY)
Admission: EM | Admit: 2022-03-12 | Discharge: 2022-03-12 | Disposition: A | Discharge status: Home / Self Care | Payer: Medicare HMO | Attending: Emergency Medicine | Admitting: Emergency Medicine

## 2022-03-12 ENCOUNTER — Other Ambulatory Visit: Payer: Self-pay

## 2022-03-12 ENCOUNTER — Encounter (HOSPITAL_COMMUNITY): Payer: Self-pay

## 2022-03-12 DIAGNOSIS — R319 Hematuria, unspecified: Secondary | ICD-10-CM | POA: Insufficient documentation

## 2022-03-12 LAB — CBC WITH DIFFERENTIAL/PLATELET
Abs Immature Granulocytes: 0.02 10*3/uL (ref 0.00–0.07)
Basophils Absolute: 0 10*3/uL (ref 0.0–0.1)
Basophils Relative: 0 %
Eosinophils Absolute: 0.1 10*3/uL (ref 0.0–0.5)
Eosinophils Relative: 1 %
HCT: 40.7 % (ref 36.0–46.0)
Hemoglobin: 13.3 g/dL (ref 12.0–15.0)
Immature Granulocytes: 0 %
Lymphocytes Relative: 18 %
Lymphs Abs: 1.4 10*3/uL (ref 0.7–4.0)
MCH: 30.5 pg (ref 26.0–34.0)
MCHC: 32.7 g/dL (ref 30.0–36.0)
MCV: 93.3 fL (ref 80.0–100.0)
Monocytes Absolute: 0.4 10*3/uL (ref 0.1–1.0)
Monocytes Relative: 5 %
Neutro Abs: 5.9 10*3/uL (ref 1.7–7.7)
Neutrophils Relative %: 76 %
Platelets: 284 10*3/uL (ref 150–400)
RBC: 4.36 MIL/uL (ref 3.87–5.11)
RDW: 13.2 % (ref 11.5–15.5)
WBC: 7.9 10*3/uL (ref 4.0–10.5)
nRBC: 0 % (ref 0.0–0.2)

## 2022-03-12 LAB — URINALYSIS, ROUTINE W REFLEX MICROSCOPIC
Bacteria, UA: NONE SEEN
Bilirubin Urine: NEGATIVE
Glucose, UA: NEGATIVE mg/dL
Ketones, ur: NEGATIVE mg/dL
Leukocytes,Ua: NEGATIVE
Nitrite: NEGATIVE
Protein, ur: NEGATIVE mg/dL
Specific Gravity, Urine: 1.018 (ref 1.005–1.030)
pH: 5 (ref 5.0–8.0)

## 2022-03-12 LAB — BASIC METABOLIC PANEL
Anion gap: 8 (ref 5–15)
BUN: 12 mg/dL (ref 8–23)
CO2: 26 mmol/L (ref 22–32)
Calcium: 9.2 mg/dL (ref 8.9–10.3)
Chloride: 104 mmol/L (ref 98–111)
Creatinine, Ser: 0.81 mg/dL (ref 0.44–1.00)
GFR, Estimated: >60 mL/min (ref >60)
Glucose, Bld: 112 mg/dL — ABNORMAL HIGH (ref 70–99)
Potassium: 3.7 mmol/L (ref 3.5–5.1)
Sodium: 138 mmol/L (ref 135–145)

## 2022-03-12 NOTE — ED Notes (Signed)
Pt shown to lobby. Verbalized understanding discharge instructions and follow-up. In no acute distress.  ? ?

## 2022-03-12 NOTE — ED Provider Notes (Signed)
?[facility] ?Provider Note ? ? ?CSN: [id number] ?Arrival date & time: 03/12/22  7564 ? ?  ? ?History ? ?Chief Complaint  ?Patient presents with  ? Hematuria  ? ? ?Carolyn Raymond is a 66 y.o. female. ? ?Patient presents ER chief complaint of hematuria.  She noticed some pinkish urine several days ago and then today noticed bright red blood when she was urinating.  Otherwise denies any fevers or pain.  Denies any back pain or abdominal pain.  No vomiting or diarrhea.  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 ? ? ? ? ?  ? ?Home Medications ?Prior to Admission medications   ?Medication Sig Start Date End Date Taking? Authorizing Provider  ?cetirizine (ZYRTEC ALLERGY) 10 MG tablet Take 1 tablet (10 mg total) by mouth daily. ?Patient not taking: Reported on 10/21/2021 01/16/21   Durward Parcel, FNP  ?cyclobenzaprine (FLEXERIL) 10 MG tablet Take 1 tablet (10 mg total) by mouth at bedtime. ?Patient not taking: Reported on 10/21/2021 05/26/20   Alvino Chapel [place], PA-C  ?naproxen (NAPROSYN) 375 MG tablet Take 1 tablet (375 mg total) by mouth 2 (two) times daily. ?Patient not taking: Reported on 10/21/2021 05/26/20   Alvino Chapel, [place], PA-C  ?ondansetron (ZOFRAN-ODT) 4 MG disintegrating tablet Take 1 tablet (4 mg total) by mouth every 8 (eight) hours as needed for nausea or vomiting. ?Patient not taking: Reported on 03/12/2022 10/21/21   Elson Areas, PA-C  ?triamcinolone cream (KENALOG) 0.1 % Apply 1 application topically 2 (two) times daily. ?Patient not taking: Reported on 10/21/2021 08/13/16   Salley Scarlet, MD  ?   ? ?Allergies    ?Bee pollen and Isovue [iopamidol]   ? ?Review of Systems   ?Review of Systems  ?Constitutional:  Negative for fever.  ?HENT:  Negative for ear pain.   ?Eyes:  Negative for pain.  ?Respiratory:  Negative for cough.   ?Cardiovascular:  Negative for chest pain.  ?Gastrointestinal:  Negative for abdominal pain.  ?Genitourinary:   Negative for flank pain.  ?Musculoskeletal:  Negative for back pain.  ?Skin:  Negative for rash.  ?Neurological:  Negative for headaches.  ? ?Physical Exam ?Updated Vital Signs ?BP (!) 145/100 (BP Location: Right Arm)   Pulse 94   Temp 98.4 ?F (36.9 ?C) (Oral)   Resp 20   Ht 5\' 5"  (1.651 m)   Wt 73.5 kg   SpO2 99%   BMI 26.96 kg/m?  ?Physical Exam ?Constitutional:   ?   General: She is not in acute distress. ?   Appearance: Normal appearance.  ?HENT:  ?   Head: Normocephalic.  ?   Nose: Nose normal.  ?Eyes:  ?   Extraocular Movements: Extraocular movements intact.  ?Cardiovascular:  ?   Rate and Rhythm: Normal rate.  ?Pulmonary:  ?   Effort: Pulmonary effort is normal.  ?Abdominal:  ?   Tenderness: There is no abdominal tenderness. There is no right CVA tenderness, left CVA tenderness, guarding or rebound.  ?Musculoskeletal:     ?   General: Normal range of motion.  ?   Cervical back: Normal range of motion.  ?Neurological:  ?   General: No focal deficit present.  ?   Mental Status: She is alert. Mental status is at baseline.  ? ? ?ED Results / Procedures / Treatments   ?Labs ?(all labs ordered are listed, but only abnormal results are displayed) ?Labs Reviewed  ?URINALYSIS, ROUTINE W REFLEX MICROSCOPIC -  Abnormal; Notable for the following components:  ?    Result Value  ? Hgb urine dipstick MODERATE (*)   ? All other components within normal limits  ?BASIC METABOLIC PANEL - Abnormal; Notable for the following components:  ? Glucose, Bld 112 (*)   ? All other components within normal limits  ?URINE CULTURE  ?CBC WITH DIFFERENTIAL/PLATELET  ? ? ?EKG ?None ? ?Radiology ?No results found. ? ?Procedures ?Procedures  ? ? ?Medications Ordered in ED ?Medications - No data to display ? ?ED Course/ Medical Decision Making/ A&P ?  ?                        ?Medical Decision Making ?Amount and/or Complexity of Data Reviewed ?Labs: ordered. ? ? ?Cardiac monitor shows sinus rhythm. ? ?Chart review shows urgent care visit  December 2022 for cough. ? ?Work-up shows moderate blood in the urine but no evidence of infection no bacteria noted. ? ?Labs unremarkable kidney function appears normal. ? ?Recommending outpatient follow-up with her doctor within the week.  Recommend immediate return for worsening symptoms or any additional concerns. ? ? ? ? ? ? ? ?Final Clinical Impression(s) / ED Diagnoses ?Final diagnoses:  ?Hematuria, unspecified type  ? ? ?Rx / DC Orders ?ED Discharge Orders   ? ? None  ? ?  ? ? ?  ?Cheryll Cockayne, MD ?03/12/22 1252 ? ?

## 2022-03-12 NOTE — Discharge Instructions (Addendum)
Call your primary care doctor or specialist as discussed in the next 2-3 days.   Return immediately back to the ER if:  Your symptoms worsen within the next 12-24 hours. You develop new symptoms such as new fevers, persistent vomiting, new pain, shortness of breath, or new weakness or numbness, or if you have any other concerns.  

## 2022-03-12 NOTE — ED Triage Notes (Signed)
Patient with complaints of blood in urine this morning.  ?

## 2022-03-14 LAB — URINE CULTURE: Culture: NO GROWTH

## 2022-03-21 ENCOUNTER — Encounter: Payer: Self-pay | Admitting: Family Medicine

## 2022-03-21 ENCOUNTER — Ambulatory Visit (INDEPENDENT_AMBULATORY_CARE_PROVIDER_SITE_OTHER): Payer: Medicare HMO | Admitting: Family Medicine

## 2022-03-21 VITALS — Ht 64.5 in | Wt 165.2 lb

## 2022-03-21 DIAGNOSIS — Z1231 Encounter for screening mammogram for malignant neoplasm of breast: Secondary | ICD-10-CM | POA: Diagnosis not present

## 2022-03-21 DIAGNOSIS — R319 Hematuria, unspecified: Secondary | ICD-10-CM | POA: Diagnosis not present

## 2022-03-21 DIAGNOSIS — N3090 Cystitis, unspecified without hematuria: Secondary | ICD-10-CM

## 2022-03-21 DIAGNOSIS — E7849 Other hyperlipidemia: Secondary | ICD-10-CM

## 2022-03-21 DIAGNOSIS — Z1211 Encounter for screening for malignant neoplasm of colon: Secondary | ICD-10-CM

## 2022-03-21 DIAGNOSIS — Z13820 Encounter for screening for osteoporosis: Secondary | ICD-10-CM

## 2022-03-21 DIAGNOSIS — E559 Vitamin D deficiency, unspecified: Secondary | ICD-10-CM

## 2022-03-21 DIAGNOSIS — R7301 Impaired fasting glucose: Secondary | ICD-10-CM

## 2022-03-21 LAB — POCT URINALYSIS DIP (CLINITEK)
Bilirubin, UA: NEGATIVE
Glucose, UA: NEGATIVE mg/dL
Ketones, POC UA: NEGATIVE mg/dL
Nitrite, UA: NEGATIVE
POC PROTEIN,UA: NEGATIVE
Spec Grav, UA: >=1.03 — AB (ref 1.010–1.025)
Urobilinogen, UA: 0.2 E.U./dL
pH, UA: 5.5 (ref 5.0–8.0)

## 2022-03-21 MED ORDER — SULFAMETHOXAZOLE-TRIMETHOPRIM 800-160 MG PO TABS: 1.0000 | ORAL_TABLET | Freq: Two times a day (BID) | ORAL | 0 refills | Status: AC

## 2022-03-21 NOTE — Progress Notes (Signed)
? ?New Patient Office Visit ? ?Subjective:  ?Patient ID: Carolyn Raymond, female    DOB: Carolyn 14, 1957  Age: 66 y.o. MRN: [id number] ? ?CC:  ?Chief Complaint  ?Patient presents with  ? New Patient (Initial Visit)  ?  Previously seen with Dr. Jeanice Lim in [facility], Pt states she went to ED on 04/26 due to blood in urine, has still been noticing slight pink in her urine.   ? ? ?HPI ?Carolyn Raymond is a 66 y.o. female with past medical history of GERD, Allergic rhinitis, and hyperlipidemia presents for establishing care. She noted blood in her urine starting on 03/12/22. She reports that she saw blood every time she urinated and wiped from 4/26-4/27/23, which has decreased. She noted seeing minimal blood this morning when wiping but none in urine. She denies fatigue, dizziness syncope and urinary symptoms. ? ?Past Medical History:  ?Diagnosis Date  ? Allergy   ? Thyroid disease   ? nodules  ? ? ?Past Surgical History:  ?Procedure Laterality Date  ? CHOLECYSTECTOMY  07/18/1998  ? 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 tumor  ? VASCULAR SURGERY    ? 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  ? ? ?History reviewed. No pertinent family history. ? ?Social History  ? ?Socioeconomic History  ? Marital status: Married  ?  Spouse name: Not on file  ? Number of children: Not on file  ? Years of education: Not on file  ? Highest education level: Not on file  ?Occupational History  ? Not on file  ?Tobacco Use  ? Smoking status: Never  ? Smokeless tobacco: Never  ?Substance and Sexual Activity  ? Alcohol use: No  ? Drug use: No  ? Sexual activity: Not on file  ?Other Topics Concern  ? Not on file  ?Social History Narrative  ? Not on file  ? ?Social Determinants of Health  ? ?Financial Resource Strain: Not on file  ?Food Insecurity: Not on file  ?Transportation Needs: Not on file  ?Physical Activity: Not on file  ?Stress: Not on file  ?Social Connections: Not on file  ?Intimate  Partner Violence: Not on file  ? ? ?ROS ?Review of Systems  ?Constitutional:  Negative for fatigue and fever.  ?HENT:  Negative for sinus pressure, sinus pain, sneezing and sore throat.   ?Eyes:  Negative for pain, itching and visual disturbance.  ?Respiratory:  Negative for cough, chest tightness, shortness of breath and wheezing.   ?Cardiovascular:  Negative for chest pain and palpitations.  ?Gastrointestinal:  Negative for constipation, diarrhea, nausea and vomiting.  ?Endocrine: Negative for polydipsia, polyphagia and polyuria.  ?Genitourinary:  Positive for flank pain and hematuria. Negative for difficulty urinating, dysuria and urgency.  ?Musculoskeletal:  Negative for gait problem, neck pain and neck stiffness.  ?Skin:  Negative for rash and wound.  ?Neurological:  Negative for dizziness, tremors and headaches.  ?Hematological:  Does not bruise/bleed easily.  ?Psychiatric/Behavioral:  Negative for self-injury, sleep disturbance and suicidal ideas.   ? ?Objective:  ? ?Today's Vitals: Ht 5' 4.5" (1.638 m)   Wt 165 lb 3.2 oz (74.9 kg)   BMI 27.92 kg/m?  ? ?Physical Exam ?Constitutional:   ?   Appearance: Normal appearance.  ?HENT:  ?   Head: Normocephalic.  ?   Right Ear: External ear normal.  ?   Left Ear: External ear normal.  ?   Nose: No congestion or rhinorrhea.  ?  Mouth/Throat:  ?   Mouth: Mucous membranes are moist.  ?Eyes:  ?   Extraocular Movements: Extraocular movements intact.  ?   Pupils: Pupils are equal, round, and reactive to light.  ?Cardiovascular:  ?   Rate and Rhythm: Normal rate and regular rhythm.  ?   Pulses: Normal pulses.  ?   Heart sounds: Normal heart sounds.  ?Pulmonary:  ?   Effort: Pulmonary effort is normal.  ?   Breath sounds: Normal breath sounds.  ?Abdominal:  ?   General: Bowel sounds are normal.  ?   Palpations: Abdomen is soft.  ?   Tenderness: There is no abdominal tenderness. There is no right CVA tenderness or left CVA tenderness.  ?   Hernia: No hernia is present.   ?Musculoskeletal:  ?   Cervical back: No rigidity or tenderness.  ?   Right lower leg: No edema.  ?   Left lower leg: No edema.  ?Skin: ?   General: Skin is warm.  ?   Capillary Refill: Capillary refill takes less than 2 seconds.  ?   Findings: No lesion or rash.  ?Neurological:  ?   Mental Status: She is alert and oriented to person, place, and time.  ?Psychiatric:  ?   Comments: Normal affect  ? ? ?Assessment & Plan:  ? ?Problem List Items Addressed This Visit   ? ?  ? Genitourinary  ? Painless hematuria  ?  -Trace of leukocytes noted on urine dipstick ?-Will treat for UTI and will refer to urology if hematuria persists for further evaluation ? ?  ?  ? Relevant Orders  ? POCT URINALYSIS DIP (CLINITEK) (Completed)  ?  ? Other  ? Hyperlipidemia  ?  Pending labs ? ?  ?  ? Relevant Orders  ? TSH + free T4 (Completed)  ? Lipid panel (Completed)  ? Vitamin D deficiency  ?  Pending labs ? ?  ?  ? Relevant Orders  ? Vitamin D (25 hydroxy) (Completed)  ? ?Other Visit Diagnoses   ? ? Screening for malignant neoplasm of colon    -  Primary  ? Relevant Orders  ? Ambulatory referral to Gastroenterology  ? Breast cancer screening by mammogram      ? Relevant Orders  ? MM DIAG BREAST TOMO UNI LEFT  ? MM DIAG BREAST TOMO UNI RIGHT  ? MM 3D SCREEN BREAST BILATERAL  ? Cystitis      ? Relevant Medications  ? sulfamethoxazole-trimethoprim (BACTRIM DS) 800-160 MG tablet  ? IFG (impaired fasting glucose)      ? Relevant Orders  ? Hemoglobin A1c (Completed)  ? Osteoporosis screening      ? Relevant Orders  ? DG Bone Density  ? Colon cancer screening      ? ?  ? ? ?Outpatient Encounter Medications as of 03/21/2022  ?Medication Sig  ? ondansetron (ZOFRAN-ODT) 4 MG disintegrating tablet Take 1 tablet (4 mg total) by mouth every 8 (eight) hours as needed for nausea or vomiting.  ? sulfamethoxazole-trimethoprim (BACTRIM DS) 800-160 MG tablet Take 1 tablet by mouth 2 (two) times daily for 6 days.  ? triamcinolone cream (KENALOG) 0.1 % Apply 1  application topically 2 (two) times daily.  ? cetirizine (ZYRTEC ALLERGY) 10 MG tablet Take 1 tablet (10 mg total) by mouth daily. (Patient not taking: Reported on 03/21/2022)  ? cyclobenzaprine (FLEXERIL) 10 MG tablet Take 1 tablet (10 mg total) by mouth at bedtime. (Patient not taking: Reported on 03/21/2022)  ?  naproxen (NAPROSYN) 375 MG tablet Take 1 tablet (375 mg total) by mouth 2 (two) times daily. (Patient not taking: Reported on 03/21/2022)  ? ?Facility-Administered Encounter Medications as of 03/21/2022  ?Medication  ? ondansetron (ZOFRAN-ODT) disintegrating tablet 4 mg  ? ? ?Follow-up: No follow-ups on file.  ? ?Alvira Monday, FNP ?

## 2022-03-21 NOTE — Patient Instructions (Addendum)
I appreciate the opportunity to provide care to you today! ?  ?Follow up: 6 months ? ?Labs: please stop by the lab today to have your blood drawn ? ?Referrals today-  GI ( colonoscopy) ? ?Please stop by [facility] hospital for your DEXA scan and Mammogram ?  ?Please continue to implement a heart healthy diet and walk for 30 mins at least 3 times daily ? ?Please pick up your antibiotic at your pharmacy ? ?Please complete the full course of the antibiotics  ? ?You can help prevent UTIs by doing the following: ? ?-Avoid holding urine for prolonged periods; this stretches the bladder and causes bacteria to form because bacteria like warm and wet environments to grow ?-Empty the bladder as soon as the need arises.  ?-Empty your bladder soon after intercourse.  ?-Take showers instead of baths ?-Wipe front to back; doing so after urinating and after a bowel movement helps prevent bacteria in the anal region from spreading to the vagina and urethra. ?-Also, drink a full glass of water to help flush bacteria.  ? ? ?  ?It was a pleasure to see you and I look forward to continuing to work together on your health and well-being. ?Please do not hesitate to call the office if you need care or have questions about your care. ?  ?Have a wonderful day and week. ?With Gratitude, ?Gilmore Laroche MSN, FNP-BC ? ?

## 2022-03-22 ENCOUNTER — Other Ambulatory Visit: Payer: Self-pay | Admitting: Family Medicine

## 2022-03-22 DIAGNOSIS — R319 Hematuria, unspecified: Secondary | ICD-10-CM | POA: Insufficient documentation

## 2022-03-22 DIAGNOSIS — E7849 Other hyperlipidemia: Secondary | ICD-10-CM

## 2022-03-22 LAB — LIPID PANEL
Chol/HDL Ratio: 4.1 ratio (ref 0.0–4.4)
Cholesterol, Total: 303 mg/dL — ABNORMAL HIGH (ref 100–199)
HDL: 74 mg/dL (ref >39)
LDL Chol Calc (NIH): 219 mg/dL — ABNORMAL HIGH (ref 0–99)
Triglycerides: 66 mg/dL (ref 0–149)
VLDL Cholesterol Cal: 10 mg/dL (ref 5–40)

## 2022-03-22 LAB — TSH+FREE T4
Free T4: 1.02 ng/dL (ref 0.82–1.77)
TSH: 0.578 u[IU]/mL (ref 0.450–4.500)

## 2022-03-22 LAB — HEMOGLOBIN A1C
Est. average glucose Bld gHb Est-mCnc: 120 mg/dL
Hgb A1c MFr Bld: 5.8 % — ABNORMAL HIGH (ref 4.8–5.6)

## 2022-03-22 LAB — VITAMIN D 25 HYDROXY (VIT D DEFICIENCY, FRACTURES): Vit D, 25-Hydroxy: 33.9 ng/mL (ref 30.0–100.0)

## 2022-03-22 MED ORDER — ROSUVASTATIN CALCIUM 20 MG PO TABS: 20.0000 mg | ORAL_TABLET | Freq: Every day | ORAL | 3 refills | Status: DC

## 2022-03-22 NOTE — Assessment & Plan Note (Signed)
-  Trace of leukocytes noted on urine dipstick ?-Will treat for UTI and will refer to urology if hematuria persists for further evaluation ?

## 2022-03-22 NOTE — Assessment & Plan Note (Signed)
Pending labs

## 2022-03-24 ENCOUNTER — Telehealth: Payer: Self-pay

## 2022-03-24 NOTE — Telephone Encounter (Signed)
Call returned, went over labs.  

## 2022-03-24 NOTE — Telephone Encounter (Signed)
Patient returning lab result call. Call back # [phone]. ?

## 2022-03-28 ENCOUNTER — Ambulatory Visit (INDEPENDENT_AMBULATORY_CARE_PROVIDER_SITE_OTHER): Payer: Medicare HMO

## 2022-03-28 ENCOUNTER — Telehealth: Payer: Self-pay

## 2022-03-28 DIAGNOSIS — R319 Hematuria, unspecified: Secondary | ICD-10-CM

## 2022-03-28 LAB — POCT URINALYSIS DIP (CLINITEK)
Bilirubin, UA: NEGATIVE
Glucose, UA: NEGATIVE mg/dL
Ketones, POC UA: NEGATIVE mg/dL
Leukocytes, UA: NEGATIVE
Nitrite, UA: NEGATIVE
POC PROTEIN,UA: NEGATIVE
Spec Grav, UA: >=1.03 — AB (ref 1.010–1.025)
Urobilinogen, UA: 0.2 E.U./dL
pH, UA: 5.5 (ref 5.0–8.0)

## 2022-03-28 NOTE — Telephone Encounter (Signed)
Please ask the patient to leave a urine sample before prescribing another course of treatment

## 2022-03-28 NOTE — Telephone Encounter (Signed)
Patient was told to call nurse when has taken antibiotics some what better, please return call at [phone]. ?

## 2022-03-28 NOTE — Telephone Encounter (Signed)
Pt states she has finished the antibiotic course last night, she is not completely better states she noticed some pink when she wiped, would like to have something else called in?   ?

## 2022-03-28 NOTE — Telephone Encounter (Signed)
Spoke to pt, she will be coming to leave urine sample today.  ?

## 2022-03-31 ENCOUNTER — Other Ambulatory Visit: Payer: Self-pay | Admitting: Family Medicine

## 2022-03-31 DIAGNOSIS — E039 Hypothyroidism, unspecified: Secondary | ICD-10-CM

## 2022-03-31 DIAGNOSIS — R319 Hematuria, unspecified: Secondary | ICD-10-CM

## 2022-03-31 NOTE — Progress Notes (Signed)
Please inform her that I've sent in a referral to urology

## 2022-03-31 NOTE — Progress Notes (Signed)
Please inform the patient that her urinalysis shows no signs of UTI, so an antibiotic is not warranted. Ask if she still has blood in her urine and if it has subsided.

## 2022-04-04 ENCOUNTER — Encounter: Payer: Self-pay | Admitting: *Deleted

## 2022-04-09 ENCOUNTER — Ambulatory Visit (HOSPITAL_COMMUNITY)
Admission: RE | Admit: 2022-04-09 | Discharge: 2022-04-09 | Disposition: A | Discharge status: Home / Self Care | Payer: Medicare HMO | Source: Ambulatory Visit | Attending: Family Medicine | Admitting: Family Medicine

## 2022-04-09 DIAGNOSIS — Z1231 Encounter for screening mammogram for malignant neoplasm of breast: Secondary | ICD-10-CM | POA: Insufficient documentation

## 2022-04-09 DIAGNOSIS — Z780 Asymptomatic menopausal state: Secondary | ICD-10-CM | POA: Diagnosis not present

## 2022-04-09 DIAGNOSIS — Z13820 Encounter for screening for osteoporosis: Secondary | ICD-10-CM | POA: Diagnosis not present

## 2022-04-09 DIAGNOSIS — M8588 Other specified disorders of bone density and structure, other site: Secondary | ICD-10-CM | POA: Diagnosis not present

## 2022-04-11 NOTE — Progress Notes (Signed)
Please inform the patient that her dexa scan test shows that she has osteopenia. Advise her to take  OTC calcium 1200mg /dl and vit d daily

## 2022-04-15 ENCOUNTER — Encounter: Payer: Self-pay | Admitting: *Deleted

## 2022-04-17 ENCOUNTER — Encounter (HOSPITAL_COMMUNITY): Payer: Self-pay

## 2022-04-17 ENCOUNTER — Ambulatory Visit (INDEPENDENT_AMBULATORY_CARE_PROVIDER_SITE_OTHER): Payer: Medicare HMO | Admitting: Physician Assistant

## 2022-04-17 ENCOUNTER — Ambulatory Visit (HOSPITAL_COMMUNITY)
Admission: RE | Admit: 2022-04-17 | Discharge: 2022-04-17 | Disposition: A | Discharge status: Home / Self Care | Payer: Medicare HMO | Source: Ambulatory Visit | Attending: Physician Assistant | Admitting: Physician Assistant

## 2022-04-17 VITALS — BP 128/85 | HR 86 | Ht 64.5 in | Wt 162.0 lb

## 2022-04-17 DIAGNOSIS — R310 Gross hematuria: Secondary | ICD-10-CM

## 2022-04-17 DIAGNOSIS — Z905 Acquired absence of kidney: Secondary | ICD-10-CM | POA: Diagnosis not present

## 2022-04-17 DIAGNOSIS — R8271 Bacteriuria: Secondary | ICD-10-CM

## 2022-04-17 LAB — URINALYSIS, ROUTINE W REFLEX MICROSCOPIC
Bilirubin, UA: NEGATIVE
Glucose, UA: NEGATIVE
Ketones, UA: NEGATIVE
Nitrite, UA: NEGATIVE
Specific Gravity, UA: >1.03 — ABNORMAL HIGH (ref 1.005–1.030)
Urobilinogen, Ur: 1 mg/dL (ref 0.2–1.0)
pH, UA: 6 (ref 5.0–7.5)

## 2022-04-17 LAB — MICROSCOPIC EXAMINATION: Renal Epithel, UA: NONE SEEN /hpf

## 2022-04-17 NOTE — Progress Notes (Signed)
04/17/2022 9:35 AM   Carolyn Raymond 05/28/1956 [id number]   Assessment:  1. Gross hematuria - Urinalysis, Routine w reflex microscopic - CT HEMATURIA WORKUP; Future  2. Bacteria in urine - Urine Culture  3. Solitary kidney, acquired    Plan: Today I had a discussion with the patient regarding the findings of gross hematuria including the implications and differential diagnoses associated with it.  I also discussed recommendations for further evaluation including the rationale for upper tract imaging and cystoscopy.  I discussed the nature of these procedures including potential risk and complications.  The patient expressed an understanding of these issues.  Urine was sent for culture and will treat if indicated by results.  CT hematuria study ordered and the patient will follow-up for cystoscopic exam after CT.  Chief Complaint: No chief complaint on file.   Referring provider: Gilmore Laroche, FNP [address] [place],  [place] [address]   History of Present Illness:  Carolyn Raymond is a 66 y.o. year old female with history of right-sided nephrectomy in 1999 who is seen in consultation from Gilmore Laroche, FNP for evaluation of gross hematuria.  Patient reports she noted her urine to have a pink tinge several weeks ago and then became grossly red.  She was evaluated in the emergency department on 03/12/2022 and urogram and was noted to have 6-10 RBCs with no bacteria or white cells.  Urine culture was negative.  CBC was without abnormalities and her renal function in normal limits.Since that time, she has had intermittent gross hematuria but it has essentially cleared over the past week.  She has had no discomfort, burning, abdominal pain, fever, chills, nausea or vomiting.  She denies vaginal discharge.  No history of urinary stones.  She is a non-smoker and smoked only occasionally while in college.  She is on no anticoagulation medication.  No history of bleeding  disorders.  Her nephrectomy in 1999 was for a mass and she is unsure of whether it was benign or not.  She reports no follow-up since the procedure.  No operative or office visit notes in EMR concerning nephrectomy. Medical records personally reviewed during pt's OV   UA = 6-10 WBCs, 0-2 RBCs, few bacteria, nitrite negative   Past Medical History:  Past Medical History:  Diagnosis Date   Allergy    Thyroid disease    nodules    Past Surgical History:  Past Surgical History:  Procedure Laterality Date   CHOLECYSTECTOMY  07/18/1998   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    Allergies:  Allergies  Allergen Reactions   Bee Pollen    Isovue [Iopamidol]     Family History:  No family history on file.  Social History:  Social History   Tobacco Use   Smoking status: Never   Smokeless tobacco: Never  Substance Use Topics   Alcohol use: No   Drug use: No    Review of symptoms:  Constitutional:  Negative for unexplained weight loss, night sweats, fever, chills ENT:  Negative for nose bleeds, sinus pain, painful swallowing CV:  Negative for chest pain, shortness of breath, exercise intolerance, palpitations, loss of consciousness Resp:  Negative for cough, wheezing, shortness of breath GI:  Negative for nausea, vomiting, diarrhea, bloody stools GU:  Positives noted in HPI; otherwise negative for urgency, frequency, urinary incontinence Neuro:  Negative for seizures, poor balance, limb weakness, slurred speech Psych:  Negative for lack of energy, depression, anxiety Endocrine:  Negative for polydipsia, polyuria, symptoms of hypoglycemia (dizziness, hunger, sweating) Hematologic:  Negative for anemia, purpura, petechia, prolonged or excessive bleeding, use of anticoagulants   Physical Exam: BP 128/85   Pulse 86   Ht 5' 4.5" (1.638 m)   Wt 162 lb (73.5 kg)   BMI  27.38 kg/m   Constitutional:  Alert and oriented, No acute distress. HEENT: NCAT, moist mucus membranes.  Trachea midline, no masses. Cardiovascular: Regular rate and rhythm without murmur, rub, or gallops. No clubbing, cyanosis, or edema. Respiratory: Normal respiratory effort, clear to auscultation bilaterally GI: Abdomen is soft, nontender, nondistended, no abdominal masses BACK:  Non-tender to palpation.  No CVAT Lymph: No cervical or inguinal lymphadenopathy. Skin: No obvious rashes, warm, dry, intact Neurologic: Alert and oriented, Cranial nerves grossly intact, no focal deficits, moving all 4 extremities. Psychiatric: Appropriate. Normal mood and affect.  Laboratory Data: No results found for this or any previous visit (from the past 24 hour(s)).  Lab Results  Component Value Date   WBC 7.9 03/12/2022   HGB 13.3 03/12/2022   HCT 40.7 03/12/2022   MCV 93.3 03/12/2022   PLT 284 03/12/2022    Lab Results  Component Value Date   CREATININE 0.81 03/12/2022    Lab Results  Component Value Date   HGBA1C 5.8 (H) 03/21/2022    Urinalysis    Component Value Date/Time   COLORURINE YELLOW 03/12/2022 1009   APPEARANCEUR CLEAR 03/12/2022 1009   LABSPEC 1.018 03/12/2022 1009   PHURINE 5.0 03/12/2022 1009   GLUCOSEU NEGATIVE 03/12/2022 1009   HGBUR MODERATE (A) 03/12/2022 1009   BILIRUBINUR negative 03/28/2022 1612   KETONESUR negative 03/28/2022 1612   KETONESUR NEGATIVE 03/12/2022 1009   PROTEINUR NEGATIVE 03/12/2022 1009   UROBILINOGEN 0.2 03/28/2022 1612   UROBILINOGEN 1 07/04/2013 1539   NITRITE Negative 03/28/2022 1612   NITRITE NEGATIVE 03/12/2022 1009   LEUKOCYTESUR Negative 03/28/2022 1612   LEUKOCYTESUR NEGATIVE 03/12/2022 1009    Lab Results  Component Value Date   BACTERIA NONE SEEN 03/12/2022    Pertinent Imaging: No results found for this or any previous visit.  No results found for this or any previous visit.     [place], Regan Rakers,  PA-C [facility] [place]

## 2022-04-18 ENCOUNTER — Telehealth: Payer: Self-pay

## 2022-04-18 NOTE — Telephone Encounter (Signed)
Patient aware and voiced understanding

## 2022-04-18 NOTE — Telephone Encounter (Signed)
-----   Message from Sydnee Levans, [place] sent at 04/18/2022  1:49 PM EDT ----- Please let pt know that Dr. Ronne Binning would prefer and MRI with a different contrast(not the contrast that she is allergic to). I will cancel the CT order and order the MRI today

## 2022-04-19 LAB — URINE CULTURE

## 2022-04-21 ENCOUNTER — Telehealth: Payer: Self-pay

## 2022-04-21 NOTE — Telephone Encounter (Signed)
Patient called back.  Left a voice message on 04-21-22.  Call after 5 pm to discuss MRI or CT scan.  Thanks, Rosey Bath

## 2022-04-21 NOTE — Telephone Encounter (Signed)
Tried to call patient back after 5pm as she requested.  No answer at either number and unable to leave voicemail.

## 2022-04-21 NOTE — Telephone Encounter (Signed)
Patient called to verify if she would be having a CT or MRI at [facility].  Returned her call to inform her of the CT [place] ordered, no answer and mailbox full, unable to leave a voicemail.

## 2022-04-22 ENCOUNTER — Ambulatory Visit (HOSPITAL_COMMUNITY): Payer: Medicare HMO

## 2022-04-22 ENCOUNTER — Other Ambulatory Visit: Payer: Self-pay | Admitting: Physician Assistant

## 2022-04-22 ENCOUNTER — Encounter (HOSPITAL_COMMUNITY): Payer: Self-pay

## 2022-04-22 DIAGNOSIS — R319 Hematuria, unspecified: Secondary | ICD-10-CM

## 2022-04-22 NOTE — Progress Notes (Signed)
The patient is allergic to CT contrast and will need MRI with and without contrast of the abdomen and pelvis for evaluation of her painless hematuria.  Discussed with Dr. Alyson Ingles who agrees with proceeding with MRI.

## 2022-04-23 ENCOUNTER — Other Ambulatory Visit: Payer: Medicare HMO | Admitting: Urology

## 2022-05-01 ENCOUNTER — Ambulatory Visit (INDEPENDENT_AMBULATORY_CARE_PROVIDER_SITE_OTHER): Payer: Medicare HMO | Admitting: *Deleted

## 2022-05-01 DIAGNOSIS — Z0000 Encounter for general adult medical examination without abnormal findings: Secondary | ICD-10-CM

## 2022-05-01 NOTE — Progress Notes (Signed)
Subjective:   Carolyn Raymond is a 66 y.o. female who presents for an Initial Medicare Annual Wellness Visit.  I connected with  Carolyn Raymond on 05/01/22 by a audio enabled telemedicine application and verified that I am speaking with the correct person using two identifiers.  Patient Location: Home  Provider Location: Office/Clinic  I discussed the limitations of evaluation and management by telemedicine. The patient expressed understanding and agreed to proceed.   Review of Systems     Carolyn Raymond , Thank you for taking time to come for your Medicare Wellness Visit. I appreciate your ongoing commitment to your health goals. Please review the following plan we discussed and let me know if I can assist you in the future.   These are the goals we discussed:  Goals   None     This is a list of the screening recommended for you and due dates:  Health Maintenance  Topic Date Due   Zoster (Shingles) Vaccine (1 of 2) Never done   Colon Cancer Screening  06/21/2018   Tetanus Vaccine  01/30/2020   COVID-19 Vaccine (3 - Moderna series) 06/28/2020   Pneumonia Vaccine (1 - PCV) Never done   Flu Shot  06/17/2022   Mammogram  04/09/2024   DEXA scan (bone density measurement)  Completed   Hepatitis C Screening: USPSTF Recommendation to screen - Ages 68-79 yo.  Completed   HPV Vaccine  Aged Out          Objective:    There were no vitals filed for this visit. There is no height or weight on file to calculate BMI.     03/12/2022   10:00 AM  Advanced Directives  Does Patient Have a Medical Advance Directive? No    Current Medications (verified) Outpatient Encounter Medications as of 05/01/2022  Medication Sig   cetirizine (ZYRTEC ALLERGY) 10 MG tablet Take 1 tablet (10 mg total) by mouth daily.   cyclobenzaprine (FLEXERIL) 10 MG tablet Take 1 tablet (10 mg total) by mouth at bedtime.   naproxen (NAPROSYN) 375 MG tablet Take 1 tablet (375 mg total) by mouth 2  (two) times daily.   ondansetron (ZOFRAN-ODT) 4 MG disintegrating tablet Take 1 tablet (4 mg total) by mouth every 8 (eight) hours as needed for nausea or vomiting.   rosuvastatin (CRESTOR) 20 MG tablet Take 1 tablet (20 mg total) by mouth daily.   triamcinolone cream (KENALOG) 0.1 % Apply 1 application topically 2 (two) times daily.   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   No family history on file. Social History   Socioeconomic History   Marital status: Married    Spouse name: Not on file   Number of children: Not on file   Years of education: Not on file   Highest education level: Not on file  Occupational History   Not on file  Tobacco Use   Smoking status: Never   Smokeless tobacco: Never  Substance and Sexual Activity  Alcohol use: No   Drug use: No   Sexual activity: Not on file  Other Topics Concern   Not on file  Social History Narrative   Not on file   Social Determinants of Health   Financial Resource Strain: Not on file  Food Insecurity: Not on file  Transportation Needs: Not on file  Physical Activity: Not on file  Stress: Not on file  Social Connections: Not on file    Tobacco Counseling Counseling given: Not Answered   Clinical Intake:                 Diabetic?no         Activities of Daily Living     No data to display           Patient Care Team: Gilmore Laroche, FNP as PCP - General (Family Medicine)  Indicate any recent Medical Services you  may have received from other than [facility] providers in the past year (date may be approximate).     Assessment:   This is a routine wellness examination for [place].  Hearing/Vision screen No results found.  Dietary issues and exercise activities discussed:     Goals Addressed   None   Depression Screen    03/21/2022   11:13 AM 10/29/2015   10:43 AM 02/26/2015    3:19 PM  PHQ 2/9 Scores  PHQ - 2 Score 0 0 2  PHQ- 9 Score   2    Fall Risk    03/21/2022   11:13 AM 10/29/2015   10:43 AM 02/26/2015    3:19 PM  Fall Risk   Falls in the past year? 0 No No  Number falls in past yr: 0    Injury with Fall? 0    Risk for fall due to : No Fall Risks    Follow up Falls evaluation completed      FALL RISK PREVENTION PERTAINING TO THE HOME:  Any stairs in or around the home? Yes  If so, are there any without handrails? No  Home free of loose throw rugs in walkways, pet beds, electrical cords, etc? Yes  Adequate lighting in your home to reduce risk of falls? Yes   ASSISTIVE DEVICES UTILIZED TO PREVENT FALLS:  Life alert? No  Use of a cane, walker or w/c? No  Grab bars in the bathroom? No  Shower chair or bench in shower? No  Elevated toilet seat or a handicapped toilet? No    Cognitive Function:        Immunizations Immunization History  Administered Date(s) Administered   Influenza,inj,Quad PF,6+ Mos 07/30/2016   Moderna Sars-Covid-2 Vaccination 04/03/2020, 05/03/2020   Td 01/29/2010    TDAP status: Due, Education has been provided regarding the importance of this vaccine. Advised may receive this vaccine at local pharmacy or Health Dept. Aware to provide a copy of the vaccination record if obtained from local pharmacy or Health Dept. Verbalized acceptance and understanding.  Flu Vaccine status: Declined, Education has been provided regarding the importance of this vaccine but patient still declined. Advised may receive this vaccine at local pharmacy or Health Dept.  Aware to provide a copy of the vaccination record if obtained from local pharmacy or Health Dept. Verbalized acceptance and understanding.  Pneumococcal vaccine status: Due, Education has been provided regarding the importance of this vaccine. Advised may receive this vaccine at local pharmacy or Health Dept. Aware to provide a copy of the vaccination record if obtained from local pharmacy  or Health Dept. Verbalized acceptance and understanding.  Covid-19 vaccine status: Completed vaccines  Qualifies for Shingles Vaccine? Yes   Zostavax completed No   Shingrix Completed?: No.    Education has been provided regarding the importance of this vaccine. Patient has been advised to call insurance company to determine out of pocket expense if they have not yet received this vaccine. Advised may also receive vaccine at local pharmacy or Health Dept. Verbalized acceptance and understanding.  Screening Tests Health Maintenance  Topic Date Due   Zoster Vaccines- Shingrix (1 of 2) Never done   COLONOSCOPY (Pts 45-81yrs Insurance coverage will need to be confirmed)  06/21/2018   TETANUS/TDAP  01/30/2020   COVID-19 Vaccine (3 - Moderna series) 06/28/2020   Pneumonia Vaccine 46+ Years old (1 - PCV) Never done   INFLUENZA VACCINE  06/17/2022   MAMMOGRAM  04/09/2024   DEXA SCAN  Completed   Hepatitis C Screening  Completed   HPV VACCINES  Aged Out    Health Maintenance  Health Maintenance Due  Topic Date Due   Zoster Vaccines- Shingrix (1 of 2) Never done   COLONOSCOPY (Pts 45-25yrs Insurance coverage will need to be confirmed)  06/21/2018   TETANUS/TDAP  01/30/2020   COVID-19 Vaccine (3 - Moderna series) 06/28/2020   Pneumonia Vaccine 20+ Years old (1 - PCV) Never done    Colorectal cancer screening: Referral to GI placed awaiting dr Jeanella Flattery office. Pt aware the office will call re: appt.  Mammogram status: Completed 04-09-22. Repeat every year  Bone Density status: Completed 04-09-22. Results  reflect: Bone density results: NORMAL. Repeat every 3 years.  Lung Cancer Screening: (Low Dose CT Chest recommended if Age 22-80 years, 30 pack-year currently smoking OR have quit w/in 15years.) does not qualify.   Lung Cancer Screening Referral: NA  Additional Screening:  Hepatitis C Screening: does not qualify; Completed NA  Vision Screening: Recommended annual ophthalmology exams for early detection of glaucoma and other disorders of the eye. Is the patient up to date with their annual eye exam?  No  Who is the provider or what is the name of the office in which the patient attends annual eye exams? [facility]  If pt is not established with a provider, would they like to be referred to a provider to establish care? No .   Dental Screening: Recommended annual dental exams for proper oral hygiene  Community Resource Referral / Chronic Care Management: CRR required this visit?  No   CCM required this visit?  No      Plan:     I have personally reviewed and noted the following in the patient's chart:   Medical and social history Use of alcohol, tobacco or illicit drugs  Current medications and supplements including opioid prescriptions. Patient is not currently taking opioid prescriptions. Functional ability and status Nutritional status Physical activity Advanced directives List of other physicians Hospitalizations, surgeries, and ER visits in previous 12 months Vitals Screenings to include cognitive, depression, and falls Referrals and appointments  In addition, I have reviewed and discussed with patient certain preventive protocols, quality metrics, and best practice recommendations. A written personalized care plan for preventive services as well as general preventive health recommendations were provided to patient.     Park Breed, CMA   05/01/2022   Nurse Notes:  Ms. Balik , Thank you for taking time to come for your Medicare Wellness Visit. I  appreciate your ongoing commitment to your health goals. Please  review the following plan we discussed and let me know if I can assist you in the future.   These are the goals we discussed:  Goals   None     This is a list of the screening recommended for you and due dates:  Health Maintenance  Topic Date Due   Zoster (Shingles) Vaccine (1 of 2) Never done   Colon Cancer Screening  06/21/2018   Tetanus Vaccine  01/30/2020   COVID-19 Vaccine (3 - Moderna series) 06/28/2020   Pneumonia Vaccine (1 - PCV) Never done   Flu Shot  06/17/2022   Mammogram  04/09/2024   DEXA scan (bone density measurement)  Completed   Hepatitis C Screening: USPSTF Recommendation to screen - Ages 43-79 yo.  Completed   HPV Vaccine  Aged Out

## 2022-05-01 NOTE — Patient Instructions (Signed)
[name] , Thank you for taking time to come for your Medicare Wellness Visit. I appreciate your ongoing commitment to your health goals. Please review the following plan we discussed and let me know if I can assist you in the future.   Screening recommendations/referrals: Colonoscopy: is awaiting Dr Jeanella Flattery office  Mammogram: completed Bone Density: completed Recommended yearly ophthalmology/optometry visit for glaucoma screening and checkup Recommended yearly dental visit for hygiene and checkup  Vaccinations: Influenza vaccine: declined Pneumococcal vaccine: due now Tdap vaccine: due now Shingles vaccine: due now    Advanced directives: declined information   Next appointment: 1 year   Preventive Care 65 Years and Older, Female Preventive care refers to lifestyle choices and visits with your health care provider that can promote health and wellness. What does preventive care include? A yearly physical exam. This is also called an annual well check. Dental exams once or twice a year. Routine eye exams. Ask your health care provider how often you should have your eyes checked. Personal lifestyle choices, including: Daily care of your teeth and gums. Regular physical activity. Eating a healthy diet. Avoiding tobacco and drug use. Limiting alcohol use. Practicing safe sex. Taking low-dose aspirin every day. Taking vitamin and mineral supplements as recommended by your health care provider. What happens during an annual well check? The services and screenings done by your health care provider during your annual well check will depend on your age, overall health, lifestyle risk factors, and family history of disease. Counseling  Your health care provider may ask you questions about your: Alcohol use. Tobacco use. Drug use. Emotional well-being. Home and relationship well-being. Sexual activity. Eating habits. History of falls. Memory and ability to understand  (cognition). Work and work Astronomer. Reproductive health. Screening  You may have the following tests or measurements: Height, weight, and BMI. Blood pressure. Lipid and cholesterol levels. These may be checked every 5 years, or more frequently if you are over 32 years old. Skin check. Lung cancer screening. You may have this screening every year starting at age 77 if you have a 30-pack-year history of smoking and currently smoke or have quit within the past 15 years. Fecal occult blood test (FOBT) of the stool. You may have this test every year starting at age 36. Flexible sigmoidoscopy or colonoscopy. You may have a sigmoidoscopy every 5 years or a colonoscopy every 10 years starting at age 53. Hepatitis C blood test. Hepatitis B blood test. Sexually transmitted disease (STD) testing. Diabetes screening. This is done by checking your blood sugar (glucose) after you have not eaten for a while (fasting). You may have this done every 1-3 years. Bone density scan. This is done to screen for osteoporosis. You may have this done starting at age 52. Mammogram. This may be done every 1-2 years. Talk to your health care provider about how often you should have regular mammograms. Talk with your health care provider about your test results, treatment options, and if necessary, the need for more tests. Vaccines  Your health care provider may recommend certain vaccines, such as: Influenza vaccine. This is recommended every year. Tetanus, diphtheria, and acellular pertussis (Tdap, Td) vaccine. You may need a Td booster every 10 years. Zoster vaccine. You may need this after age 66. Pneumococcal 13-valent conjugate (PCV13) vaccine. One dose is recommended after age 2. Pneumococcal polysaccharide (PPSV23) vaccine. One dose is recommended after age 21. Talk to your health care provider about which screenings and vaccines you need and how often  you need them. This information is not intended to  replace advice given to you by your health care provider. Make sure you discuss any questions you have with your health care provider. Document Released: 11/30/2015 Document Revised: 07/23/2016 Document Reviewed: 09/04/2015 Elsevier Interactive Patient Education  2017 [employer] Prevention in the Home Falls can cause injuries. They can happen to people of all ages. There are many things you can do to make your home safe and to help prevent falls. What can I do on the outside of my home? Regularly fix the edges of walkways and driveways and fix any cracks. Remove anything that might make you trip as you walk through a door, such as a raised step or threshold. Trim any bushes or trees on the path to your home. Use bright outdoor lighting. Clear any walking paths of anything that might make someone trip, such as rocks or tools. Regularly check to see if handrails are loose or broken. Make sure that both sides of any steps have handrails. Any raised decks and porches should have guardrails on the edges. Have any leaves, snow, or ice cleared regularly. Use sand or salt on walking paths during winter. Clean up any spills in your garage right away. This includes oil or grease spills. What can I do in the bathroom? Use night lights. Install grab bars by the toilet and in the tub and shower. Do not use towel bars as grab bars. Use non-skid mats or decals in the tub or shower. If you need to sit down in the shower, use a plastic, non-slip stool. Keep the floor dry. Clean up any water that spills on the floor as soon as it happens. Remove soap buildup in the tub or shower regularly. Attach bath mats securely with double-sided non-slip rug tape. Do not have throw rugs and other things on the floor that can make you trip. What can I do in the bedroom? Use night lights. Make sure that you have a light by your bed that is easy to reach. Do not use any sheets or blankets that are too big for  your bed. They should not hang down onto the floor. Have a firm chair that has side arms. You can use this for support while you get dressed. Do not have throw rugs and other things on the floor that can make you trip. What can I do in the kitchen? Clean up any spills right away. Avoid walking on wet floors. Keep items that you use a lot in easy-to-reach places. If you need to reach something above you, use a strong step stool that has a grab bar. Keep electrical cords out of the way. Do not use floor polish or wax that makes floors slippery. If you must use wax, use non-skid floor wax. Do not have throw rugs and other things on the floor that can make you trip. What can I do with my stairs? Do not leave any items on the stairs. Make sure that there are handrails on both sides of the stairs and use them. Fix handrails that are broken or loose. Make sure that handrails are as long as the stairways. Check any carpeting to make sure that it is firmly attached to the stairs. Fix any carpet that is loose or worn. Avoid having throw rugs at the top or bottom of the stairs. If you do have throw rugs, attach them to the floor with carpet tape. Make sure that you have a light  switch at the top of the stairs and the bottom of the stairs. If you do not have them, ask someone to add them for you. What else can I do to help prevent falls? Wear shoes that: Do not have high heels. Have rubber bottoms. Are comfortable and fit you well. Are closed at the toe. Do not wear sandals. If you use a stepladder: Make sure that it is fully opened. Do not climb a closed stepladder. Make sure that both sides of the stepladder are locked into place. Ask someone to hold it for you, if possible. Clearly mark and make sure that you can see: Any grab bars or handrails. First and last steps. Where the edge of each step is. Use tools that help you move around (mobility aids) if they are needed. These  include: Canes. Walkers. Scooters. Crutches. Turn on the lights when you go into a dark area. Replace any light bulbs as soon as they burn out. Set up your furniture so you have a clear path. Avoid moving your furniture around. If any of your floors are uneven, fix them. If there are any pets around you, be aware of where they are. Review your medicines with your doctor. Some medicines can make you feel dizzy. This can increase your chance of falling. Ask your doctor what other things that you can do to help prevent falls. This information is not intended to replace advice given to you by your health care provider. Make sure you discuss any questions you have with your health care provider. Document Released: 08/30/2009 Document Revised: 04/10/2016 Document Reviewed: 12/08/2014 Elsevier Interactive Patient Education  2017 [employer].

## 2022-05-08 ENCOUNTER — Encounter: Payer: Self-pay | Admitting: *Deleted

## 2022-05-08 NOTE — Patient Instructions (Addendum)
Referring MD/PCP: Gilmore Laroche  Procedure: Colonoscopy  Has patient had this procedure before?  06/22/2008, Dr. Jena Gauss  If so, when, by whom and where?    Is there a family history of colon cancer?  no  Who?  What age when diagnosed?    Is patient diabetic? If yes, Type 1 or Type 2   no      Does patient have prosthetic heart valve or mechanical valve?  no  Do you have a pacemaker/defibrillator?  no  Has patient ever had endocarditis/atrial fibrillation? no  Does patient use oxygen? no  Has patient had joint replacement within last 12 months?  no  Is patient constipated or do they take laxatives? no  Does patient have a history of alcohol/drug use?  no  Have you had a stroke/heart attack last 6 mths? no  Do you take medicine for weight loss?  no  For female patients,: have you had a hysterectomy no                      are you post menopausal no                      do you still have your menstrual cycle no  Is patient on blood thinner such as Coumadin, Plavix and/or Aspirin? no  Medications:   Current Outpatient Medications on File Prior to Visit  Medication Sig Dispense Refill   Ascorbic Acid (VITAMIN C PO) Take by mouth daily.     Cholecalciferol (VITAMIN D-3) 125 MCG (5000 UT) TABS Take by mouth daily.     Garlic 1000 MG CAPS Take by mouth daily.     rosuvastatin (CRESTOR) 20 MG tablet Take 1 tablet (20 mg total) by mouth daily. 90 tablet 3   cetirizine (ZYRTEC ALLERGY) 10 MG tablet Take 1 tablet (10 mg total) by mouth daily. 30 tablet 0   ondansetron (ZOFRAN-ODT) 4 MG disintegrating tablet Take 1 tablet (4 mg total) by mouth every 8 (eight) hours as needed for nausea or vomiting. 20 tablet 0   triamcinolone cream (KENALOG) 0.1 % Apply 1 application topically 2 (two) times daily. 30 g 0   No current facility-administered medications on file prior to visit.      Allergies:  Allergies  Allergen Reactions   Bee Pollen    Isovue [Iopamidol]

## 2022-05-14 ENCOUNTER — Other Ambulatory Visit: Payer: Medicare HMO | Admitting: Urology

## 2022-05-14 ENCOUNTER — Ambulatory Visit (HOSPITAL_COMMUNITY)
Admission: RE | Admit: 2022-05-14 | Discharge: 2022-05-14 | Disposition: A | Discharge status: Home / Self Care | Payer: Medicare HMO | Source: Ambulatory Visit | Attending: Physician Assistant | Admitting: Physician Assistant

## 2022-05-14 DIAGNOSIS — Q600 Renal agenesis, unilateral: Secondary | ICD-10-CM | POA: Diagnosis not present

## 2022-05-14 DIAGNOSIS — R319 Hematuria, unspecified: Secondary | ICD-10-CM | POA: Diagnosis not present

## 2022-05-14 DIAGNOSIS — Z9049 Acquired absence of other specified parts of digestive tract: Secondary | ICD-10-CM | POA: Diagnosis not present

## 2022-05-14 MED ORDER — GADOBUTROL 1 MMOL/ML IV SOLN
7.0000 mL | Freq: Once | INTRAVENOUS | Status: AC | PRN
  Administered 2022-05-14: 7 mL via INTRAVENOUS

## 2022-05-16 ENCOUNTER — Ambulatory Visit: Payer: Medicare HMO | Admitting: Urology

## 2022-05-16 VITALS — BP 135/95 | HR 99

## 2022-05-16 DIAGNOSIS — R310 Gross hematuria: Secondary | ICD-10-CM

## 2022-05-16 DIAGNOSIS — R319 Hematuria, unspecified: Secondary | ICD-10-CM

## 2022-05-16 LAB — URINALYSIS, ROUTINE W REFLEX MICROSCOPIC
Bilirubin, UA: NEGATIVE
Glucose, UA: NEGATIVE
Ketones, UA: NEGATIVE
Leukocytes,UA: NEGATIVE
Nitrite, UA: NEGATIVE
Protein,UA: NEGATIVE
Specific Gravity, UA: 1.025 (ref 1.005–1.030)
Urobilinogen, Ur: 0.2 mg/dL (ref 0.2–1.0)
pH, UA: 5.5 (ref 5.0–7.5)

## 2022-05-16 LAB — MICROSCOPIC EXAMINATION: Renal Epithel, UA: NONE SEEN /hpf

## 2022-05-16 MED ORDER — CIPROFLOXACIN HCL 500 MG PO TABS
500.0000 mg | ORAL_TABLET | Freq: Once | ORAL | Status: AC
  Administered 2022-05-16: 500 mg via ORAL

## 2022-05-16 NOTE — H&P (View-Only) (Signed)
   05/16/22  CC: hematuria   HPI: Carolyn Raymond is a 15XW here for cystoscopy for gross hematuria.  Blood pressure (!) 135/95, pulse 99. NED. A&Ox3.   No respiratory distress   Abd soft, NT, ND Normal external genitalia with patent urethral meatus  Cystoscopy Procedure Note  Patient identification was confirmed, informed consent was obtained, and patient was prepped using Betadine solution.  Lidocaine jelly was administered per urethral meatus.    Procedure: - Flexible cystoscope introduced, without any difficulty.   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 Risks/benefits/alternatives discussed   No follow-ups on file.  Wilkie Aye, MD

## 2022-05-16 NOTE — Progress Notes (Signed)
   05/16/22  CC: hematuria   HPI: Carolyn Raymond is a 65yo here for cystoscopy for gross hematuria.  Blood pressure (!) 135/95, pulse 99. NED. A&Ox3.   No respiratory distress   Abd soft, NT, ND Normal external genitalia with patent urethral meatus  Cystoscopy Procedure Note  Patient identification was confirmed, informed consent was obtained, and patient was prepped using Betadine solution.  Lidocaine jelly was administered per urethral meatus.    Procedure: - Flexible cystoscope introduced, without any difficulty.   - Thorough search of the bladder revealed:    normal urethral meatus    normal urothelium    no stones    no ulcers     5-6mm posterior wall erythematous tumor    no urethral polyps    no trabeculation  - Ureteral orifices were normal in position and appearance.  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 Risks/benefits/alternatives discussed   No follow-ups on file.  Carolyn Laperle, MD  

## 2022-05-21 ENCOUNTER — Telehealth: Payer: Self-pay

## 2022-05-21 NOTE — Telephone Encounter (Signed)
I spoke with Carolyn Raymond  We have discussed possible surgery dates and 06/05/2022 was agreed upon by all parties. Patient given information about surgery date, what to expect pre-operatively and post operatively.    We discussed that a pre-op nurse will be calling to set up the pre-op visit that will take place prior to surgery. Informed patient that our office will communicate any additional care to be provided after surgery.    Patients questions or concerns were discussed during our call. Advised to call our office should there be any additional information, questions or concerns that arise. Patient verbalized understanding.

## 2022-05-21 NOTE — Progress Notes (Signed)
ASA 2, okay to schedule procedure.

## 2022-05-23 NOTE — Progress Notes (Signed)
Spoke with pt. She isn't sure when she can schedule her procedure yet. She will call back when she is ready

## 2022-05-27 ENCOUNTER — Encounter: Payer: Self-pay | Admitting: Family Medicine

## 2022-05-27 ENCOUNTER — Encounter: Payer: Self-pay | Admitting: Urology

## 2022-05-27 ENCOUNTER — Ambulatory Visit (INDEPENDENT_AMBULATORY_CARE_PROVIDER_SITE_OTHER): Payer: Medicare HMO | Admitting: Family Medicine

## 2022-05-27 VITALS — BP 137/89 | HR 88 | Ht 64.5 in | Wt 163.4 lb

## 2022-05-27 DIAGNOSIS — L509 Urticaria, unspecified: Secondary | ICD-10-CM | POA: Diagnosis not present

## 2022-05-27 DIAGNOSIS — R21 Rash and other nonspecific skin eruption: Secondary | ICD-10-CM | POA: Diagnosis not present

## 2022-05-27 DIAGNOSIS — L282 Other prurigo: Secondary | ICD-10-CM

## 2022-05-27 MED ORDER — HYDROXYZINE PAMOATE 25 MG PO CAPS: 25.0000 mg | ORAL_CAPSULE | Freq: Three times a day (TID) | ORAL | 0 refills | Status: DC | PRN

## 2022-05-27 MED ORDER — HYDROCORTISONE 0.5 % EX CREA: 1.0000 | TOPICAL_CREAM | Freq: Two times a day (BID) | CUTANEOUS | 0 refills | Status: DC

## 2022-05-27 NOTE — Assessment & Plan Note (Addendum)
Rash on face indicative of urticaria No symptoms of angioedema noted Will treat with low-potency topical steroids and hydroxyzine No lesion was noted at the site of the spider bite

## 2022-05-27 NOTE — Patient Instructions (Signed)
I appreciate the opportunity to provide care to you today!    Please pick up your medications at the pharmacy   Please continue to a heart-healthy diet and increase your physical activities. Try to exercise for 30mins at least three times a week.      It was a pleasure to see you and I look forward to continuing to work together on your health and well-being. Please do not hesitate to call the office if you need care or have questions about your care.   Have a wonderful day and week. With Gratitude, Maylani Embree MSN, FNP-BC  

## 2022-05-27 NOTE — Progress Notes (Signed)
   Acute Office Visit  Subjective:     Patient ID: Carolyn Raymond, female    DOB: 01-15-1956, 66 y.o.   MRN: [id number]  Chief Complaint  Patient presents with   Rash    Pt c/o rash with discoloration on face, onset since May, has used vaseline and hasnt helped.     HPI Patient is in today for c/o of a rash with discoloration on the face. She reports using a new lotion called Dermasil before the rash onset. She c/o of  rash pruritus of the face. She reports using hydrocortisone cream on the face, further irritating her skin. She doesn't remember the potency of the cream used. She notes that her skin is dry. She reports to have been bitten by a spider on her neck a week ago. No symptoms of angioedema noted.    Review of Systems  Constitutional:  Negative for chills and fever.  Eyes:  Negative for blurred vision and double vision.  Skin:  Positive for itching and rash.  Neurological:  Negative for dizziness and headaches.        Objective:    BP 137/89   Pulse 88   Ht 5' 4.5" (1.638 m)   Wt 163 lb 6.4 oz (74.1 kg)   SpO2 97%   BMI 27.61 kg/m    Physical Exam Cardiovascular:     Rate and Rhythm: Normal rate and regular rhythm.     Pulses: Normal pulses.     Heart sounds: Normal heart sounds.  Pulmonary:     Effort: Pulmonary effort is normal.     Breath sounds: Normal breath sounds.  Skin:    Findings: Rash (urticaria noted in the chin and cheeks and forehead) present.  Neurological:     Mental Status: She is alert.     No results found for any visits on 05/27/22.      Assessment & Plan:   Problem List Items Addressed This Visit       Musculoskeletal and Integument   Urticaria - Primary    Rash on face indicative of urticaria No symptoms of angioedema noted Will treat with low-potency topical steroids and hydroxyzine No lesion was noted at the site of the spider bite      Other Visit Diagnoses     Rash of face       Relevant Medications    hydrocortisone cream 0.5 %   Pruritic rash       Relevant Medications   hydrOXYzine (VISTARIL) 25 MG capsule       Meds ordered this encounter  Medications   hydrOXYzine (VISTARIL) 25 MG capsule    Sig: Take 1 capsule (25 mg total) by mouth every 8 (eight) hours as needed.    Dispense:  30 capsule    Refill:  0   hydrocortisone cream 0.5 %    Sig: Apply 1 Application topically 2 (two) times daily.    Dispense:  30 g    Refill:  0    Return if symptoms worsen or fail to improve.  Gilmore Laroche, FNP

## 2022-05-27 NOTE — Patient Instructions (Signed)
Transurethral Resection of Bladder Tumor  Transurethral resection of a bladder tumor is the removal (resection) of cancerous tissue (tumor) from the inside wall of the bladder. The bladder is the organ that holds urine. The tumor is removed through the tube that carries urine out of the body (urethra). In a transurethral resection, a thin telescope with a light, a tiny camera, and an electric cutting edge (resectoscope) is passed through the urethra. In men, the opening of the urethra is at the end of the penis. In women, it is just above the opening of the vagina. Tell a health care provider about: Any allergies you have. All medicines you are taking, including vitamins, herbs, eye drops, creams, and over-the-counter medicines. Any problems you or family members have had with anesthetic medicines. Any bleeding problems you have. Any surgeries you have had. Any medical conditions you have, including recent urinary tract infections. Whether you are pregnant or may be pregnant. What are the risks? Generally, this is a safe procedure. However, problems may occur, including: Infection. Bleeding. Allergic reactions to medicines. Damage to nearby structures or organs. Difficulty urinating from blockage of the urethra or not being able to urinate (urinary retention). Deep vein thrombosis. This is a blood clot that can develop in your leg. Recurring cancer. What happens before the procedure? When to stop eating and drinking Follow instructions from your health care provider about what you may eat and drink before your procedure. These may include: 8 hours before your procedure Stop eating most foods. Do not eat meat, fried foods, or fatty foods. Eat only light foods, such as toast or crackers. All liquids are okay except energy drinks and alcohol. 6 hours before your procedure Stop eating. Drink only clear liquids, such as water, clear fruit juice, black coffee, plain tea, and sports  drinks. Do not drink energy drinks or alcohol. 2 hours before your procedure Stop drinking all liquids. You may be allowed to take medicines with small sips of water. Medicines Ask your health care provider about: Changing or stopping your regular medicines. This is especially important if you are taking diabetes medicines or blood thinners. Taking medicines such as aspirin and ibuprofen. These medicines can thin your blood. Do not take these medicines unless your health care provider tells you to take them. Taking over-the-counter medicines, vitamins, herbs, and supplements. General instructions If you will be going home right after the procedure, plan to have a responsible adult: Take you home from the hospital or clinic. You will not be allowed to drive. Care for you for the time you are told. Ask your health care provider what steps will be taken to help prevent infection. These steps may include: Washing skin with a germ-killing soap. Taking antibiotic medicine. Do not use any products that contain nicotine or tobacco for at least 4 weeks before the procedure. These products include cigarettes, chewing tobacco, and vaping devices, such as e-cigarettes. If you need help quitting, ask your health care provider. What happens during the procedure? An IV will be inserted into one of your veins. You will be given one or more of the following: A medicine to help you relax (sedative). A medicine that is injected into your spine to numb the area below and slightly above the injection site (spinal anesthetic). A medicine that is injected into an area of your body to numb everything below the injection site (regional anesthetic). A medicine to make you fall asleep (general anesthetic). Your legs will be placed in foot rests (  stirrups) to open your legs and bend your knees. The resectoscope will be passed through your urethra and into your bladder. The part of your bladder with the tumor will be  resected by the cutting edge of the resectoscope. Fluid will be passed to rinse out the cut tissues (irrigation). The resectoscope will then be taken out. A small, thin tube (catheter) will be passed through your urethra and into your bladder. The catheter will drain urine into a bag outside of your body. The procedure may vary among health care providers and hospitals. What happens after the procedure? Your blood pressure, heart rate, breathing rate, and blood oxygen level will be monitored until you leave the hospital or clinic. You may continue to receive fluids and medicines through an IV. You will be given pain medicine to relieve pain. You will have a catheter to drain your urine. The amount of urine will be measured. If you have blood in your urine, your bladder may be rinsed out by passing fluid through your catheter. You will be encouraged to walk as soon as you can. You may have to wear compression stockings. These stockings help to prevent blood clots and reduce swelling in your legs. If you were given a sedative during the procedure, it can affect you for several hours. Do not drive or operate machinery until your health care provider says that it is safe. Summary Transurethral resection of a bladder tumor is the removal (resection) of a cancerous growth (tumor) on the inside wall of the bladder. To do this procedure, your health care provider uses a thin telescope with a light, a tiny camera, and an electric cutting edge (resectoscope) that is guided to your bladder through your urethra. The part of your bladder that is affected by the tumor will be resected by the cutting edge of the resectoscope. A catheter will be passed through your urethra and into your bladder. The catheter will drain urine into a bag outside of your body. If you will be going home right after the procedure, plan to have a responsible adult take you home from the hospital or clinic. You will not be allowed to  drive. This information is not intended to replace advice given to you by your health care provider. Make sure you discuss any questions you have with your health care provider. Document Revised: 11/08/2021 Document Reviewed: 11/08/2021 Elsevier Patient Education  2023 [employer]  

## 2022-05-29 ENCOUNTER — Telehealth: Payer: Self-pay

## 2022-05-29 NOTE — Telephone Encounter (Signed)
Patient called wanting to know when she is supposed to show up at [facility] hospital for procedure on 07/20. Gave her a number to contact with questions.  Patient aware to call back with any other issues.

## 2022-06-02 NOTE — Patient Instructions (Signed)
Carolyn Raymond  06/02/2022     @PREFPERIOPPHARMACY @   Your procedure is scheduled on  06/05/2022.   Report to 06/07/2022 at  0700  A.M.   Call this number if you have problems the morning of surgery:  [phone]   Remember:  Do not eat or drink after midnight.      Take these medicines the morning of surgery with A SIP OF WATER                                       Vistaril     Do not wear jewelry, make-up or nail polish.  Do not wear lotions, powders, or perfumes, or deodorant.  Do not shave 48 hours prior to surgery.  Men may shave face and neck.  Do not bring valuables to the hospital.  [facility] is not responsible for any belongings or valuables.  Contacts, dentures or bridgework may not be worn into surgery.  Leave your suitcase in the car.  After surgery it may be brought to your room.  For patients admitted to the hospital, discharge time will be determined by your treatment team.  Patients discharged the day of surgery will not be allowed to drive home and must have someone with them for 24 hours.    Special instructions:   DO NOT smoke tobacco or vape for 24 hours before your procedure.  Please read over the following fact sheets that you were given. Coughing and Deep Breathing, Surgical Site Infection Prevention, Anesthesia Post-op Instructions, and Care and Recovery After Surgery      Bladder Biopsy, Care After The following information offers guidance on how to care for yourself after your procedure. Your health care provider may also give you more specific instructions. If you have problems or questions, contact your health care provider. What can I expect after the procedure? After the procedure, it is common to have: Mild pain and burning in your bladder or kidney area when you urinate. Small amounts of blood in your urine. A sudden urge to urinate. A need to urinate more often than usual. Follow these instructions at  home: Medicines Take over-the-counter and prescription medicines only as told by your health care provider. If you were prescribed an antibiotic medicine, take it as told by your health care provider. Do not stop using the antibiotic even if you start to feel better. Activity Rest as told by your health care provider. If you were given a sedative during the procedure, it can affect you for several hours. Do not drive or operate machinery until your health care provider says that it is safe. Return to your normal activities as told by your health care provider. Ask your health care provider what activities are safe for you. General instructions  Take a warm bath to relieve any burning feeling around your urethra. Hold a warm, damp washcloth over the urethral area to ease pain. It is up to you to get the results of your procedure. Ask your health care provider, or the department that is doing the procedure, when your results will be ready. Keep all follow-up visits. This is important. Contact a health care provider if: Your symptoms do not improve within 24 hours, and you keep having: Burning when you urinate. More blood in your urine. Pain when you urinate. An urgent need to urinate. A  need to urinate more often than usual. Get help right away if: You have a fever. You have a lot of blood in your urine. You have bright red blood in your urine. You are passing blood clots in your urine. You have severe pain. You cannot urinate. Summary After the procedure, it is common to have mild pain in your bladder or kidney area, mild burning with urination, and some blood. Take over-the-counter and prescription medicines only as told by your health care provider. If you were prescribed an antibiotic medicine, do not stop using the antibiotic even if you start to feel better. Rest after the procedure. Follow instructions from your health care provider for home care. Get help right away if you have  a lot of blood, bright red blood, or blood clots in your urine, severe pain, or fever. This information is not intended to replace advice given to you by your health care provider. Make sure you discuss any questions you have with your health care provider. Document Revised: 10/13/2021 Document Reviewed: 10/13/2021 Elsevier Patient Education  2023 [employer] Cystoscopy Cystoscopy is a procedure that is used to help diagnose and sometimes treat conditions that affect the lower urinary tract. The lower urinary tract includes the bladder and the urethra. The urethra is the tube that drains urine from the bladder. Cystoscopy is done using a thin, tube-shaped instrument with a light and camera at the end (cystoscope). The cystoscope may be hard or flexible, depending on the goal of the procedure. The cystoscope is inserted through the urethra, into the bladder. Cystoscopy may be recommended if you have: Urinary tract infections that keep coming back. Blood in the urine (hematuria). An inability to control when you urinate (urinary incontinence) or an overactive bladder. Unusual cells found in a urine sample. A blockage in the urethra, such as a urinary stone. Painful urination. An abnormality in the bladder found during an intravenous pyelogram (IVP) or CT scan. Cystoscopy may also be done to remove a sample of tissue to be examined under a microscope (biopsy). Tell a health care provider about: Any allergies you have. All medicines you are taking, including vitamins, herbs, eye drops, creams, and over-the-counter medicines. Any problems you or family members have had with anesthetic medicines. Any blood disorders you have. Any surgeries you have had. Any medical conditions you have. Whether you are pregnant or may be pregnant. What are the risks? Generally, this is a safe procedure. However, problems may occur, including: Infection. Bleeding. Allergic reactions to medicines. Damage to  other structures or organs. What happens before the procedure? Medicines Ask your health care provider about: Changing or stopping your regular medicines. This is especially important if you are taking diabetes medicines or blood thinners. Taking medicines such as aspirin and ibuprofen. These medicines can thin your blood. Do not take these medicines unless your health care provider tells you to take them. Taking over-the-counter medicines, vitamins, herbs, and supplements. Tests You may have an exam or testing, such as: X-rays of the bladder, urethra, or kidneys. CT scan of the abdomen or pelvis. Urine tests to check for signs of infection. General instructions Follow instructions from your health care provider about eating or drinking restrictions. Ask your health care provider what steps will be taken to help prevent infection. These steps may include: Washing skin with a germ-killing soap. Taking antibiotic medicine. Plan to have a responsible adult take you home from the hospital or clinic. What happens during the procedure?  You will be  given one or more of the following: A medicine to help you relax (sedative). A medicine to numb the area (local anesthetic). The area around the opening of your urethra will be cleaned. The cystoscope will be passed through your urethra into your bladder. Germ-free (sterile) fluid will flow through the cystoscope to fill your bladder. The fluid will stretch your bladder so that your health care provider can clearly examine your bladder walls. Your doctor will look at the urethra and bladder. Your doctor may take a biopsy or remove stones. The cystoscope will be removed, and your bladder will be emptied. The procedure may vary among health care providers and hospitals. What can I expect after the procedure? After the procedure, it is common to have: Some soreness or pain in your abdomen and urethra. Urinary symptoms. These include: Mild pain or  burning when you urinate. Pain should stop within a few minutes after you urinate. This may last for up to 1 week. A small amount of blood in your urine for several days. Feeling like you need to urinate but producing only a small amount of urine. Follow these instructions at home: Medicines Take over-the-counter and prescription medicines only as told by your health care provider. If you were prescribed an antibiotic medicine, take it as told by your health care provider. Do not stop taking the antibiotic even if you start to feel better. General instructions Return to your normal activities as told by your health care provider. Ask your health care provider what activities are safe for you. If you were given a sedative during the procedure, it can affect you for several hours. Do not drive or operate machinery until your health care provider says that it is safe. Watch for any blood in your urine. If the amount of blood in your urine increases, call your health care provider. Follow instructions from your health care provider about eating or drinking restrictions. If a tissue sample was removed for testing (biopsy) during your procedure, it is up to you to get your test results. Ask your health care provider, or the department that is doing the test, when your results will be ready. Drink enough fluid to keep your urine pale yellow. Keep all follow-up visits. This is important. Contact a health care provider if: You have pain that gets worse or does not get better with medicine, especially pain when you urinate. You have trouble urinating. You have more blood in your urine. Get help right away if: You have blood clots in your urine. You have abdominal pain. You have a fever or chills. You are unable to urinate. Summary Cystoscopy is a procedure that is used to help diagnose and sometimes treat conditions that affect the lower urinary tract. Cystoscopy is done using a thin, tube-shaped  instrument with a light and camera at the end. After the procedure, it is common to have some soreness or pain in your abdomen and urethra. Watch for any blood in your urine. If the amount of blood in your urine increases, call your health care provider. If you were prescribed an antibiotic medicine, take it as told by your health care provider. Do not stop taking the antibiotic even if you start to feel better. This information is not intended to replace advice given to you by your health care provider. Make sure you discuss any questions you have with your health care provider. Document Revised: 07/17/2021 Document Reviewed: 06/15/2020 Elsevier Patient Education  2023 [employer] General Anesthesia, Adult, Care  After This sheet gives you information about how to care for yourself after your procedure. Your health care provider may also give you more specific instructions. If you have problems or questions, contact your health care provider. What can I expect after the procedure? After the procedure, the following side effects are common: Pain or discomfort at the IV site. Nausea. Vomiting. Sore throat. Trouble concentrating. Feeling cold or chills. Feeling weak or tired. Sleepiness and fatigue. Soreness and body aches. These side effects can affect parts of the body that were not involved in surgery. Follow these instructions at home: For the time period you were told by your health care provider:  Rest. Do not participate in activities where you could fall or become injured. Do not drive or use machinery. Do not drink alcohol. Do not take sleeping pills or medicines that cause drowsiness. Do not make important decisions or sign legal documents. Do not take care of children on your own. Eating and drinking Follow any instructions from your health care provider about eating or drinking restrictions. When you feel hungry, start by eating small amounts of foods that are soft and  easy to digest (bland), such as toast. Gradually return to your regular diet. Drink enough fluid to keep your urine pale yellow. If you vomit, rehydrate by drinking water, juice, or clear broth. General instructions If you have sleep apnea, surgery and certain medicines can increase your risk for breathing problems. Follow instructions from your health care provider about wearing your sleep device: Anytime you are sleeping, including during daytime naps. While taking prescription pain medicines, sleeping medicines, or medicines that make you drowsy. Have a responsible adult stay with you for the time you are told. It is important to have someone help care for you until you are awake and alert. Return to your normal activities as told by your health care provider. Ask your health care provider what activities are safe for you. Take over-the-counter and prescription medicines only as told by your health care provider. If you smoke, do not smoke without supervision. Keep all follow-up visits as told by your health care provider. This is important. Contact a health care provider if: You have nausea or vomiting that does not get better with medicine. You cannot eat or drink without vomiting. You have pain that does not get better with medicine. You are unable to pass urine. You develop a skin rash. You have a fever. You have redness around your IV site that gets worse. Get help right away if: You have difficulty breathing. You have chest pain. You have blood in your urine or stool, or you vomit blood. Summary After the procedure, it is common to have a sore throat or nausea. It is also common to feel tired. Have a responsible adult stay with you for the time you are told. It is important to have someone help care for you until you are awake and alert. When you feel hungry, start by eating small amounts of foods that are soft and easy to digest (bland), such as toast. Gradually return to your  regular diet. Drink enough fluid to keep your urine pale yellow. Return to your normal activities as told by your health care provider. Ask your health care provider what activities are safe for you. This information is not intended to replace advice given to you by your health care provider. Make sure you discuss any questions you have with your health care provider. Document Revised: 07/19/2020 Document Reviewed: 02/16/2020 Elsevier  Patient Education  2023 Else[employer] Inc.  

## 2022-06-03 ENCOUNTER — Encounter (HOSPITAL_COMMUNITY): Payer: Self-pay

## 2022-06-03 ENCOUNTER — Encounter (HOSPITAL_COMMUNITY)
Admission: RE | Admit: 2022-06-03 | Discharge: 2022-06-03 | Disposition: A | Discharge status: Home / Self Care | Payer: Medicare HMO | Source: Ambulatory Visit | Attending: Urology | Admitting: Urology

## 2022-06-03 VITALS — BP 155/82 | HR 77 | Temp 97.9°F | Resp 18 | Ht 64.5 in | Wt 163.4 lb

## 2022-06-03 DIAGNOSIS — R739 Hyperglycemia, unspecified: Secondary | ICD-10-CM | POA: Diagnosis not present

## 2022-06-03 DIAGNOSIS — Z01812 Encounter for preprocedural laboratory examination: Secondary | ICD-10-CM | POA: Diagnosis not present

## 2022-06-03 HISTORY — DX: Hyperlipidemia, unspecified: E78.5

## 2022-06-03 LAB — HEMOGLOBIN A1C
Hgb A1c MFr Bld: 5.6 % (ref 4.8–5.6)
Mean Plasma Glucose: 114.02 mg/dL

## 2022-06-05 ENCOUNTER — Encounter (HOSPITAL_COMMUNITY): Payer: Self-pay | Admitting: Urology

## 2022-06-05 ENCOUNTER — Ambulatory Visit (HOSPITAL_BASED_OUTPATIENT_CLINIC_OR_DEPARTMENT_OTHER): Payer: Medicare HMO | Admitting: Anesthesiology

## 2022-06-05 ENCOUNTER — Encounter (HOSPITAL_COMMUNITY)
Admission: RE | Disposition: A | Discharge status: Home / Self Care | Payer: Self-pay | Source: Home / Self Care | Attending: Urology

## 2022-06-05 ENCOUNTER — Ambulatory Visit (HOSPITAL_COMMUNITY)
Admission: RE | Admit: 2022-06-05 | Discharge: 2022-06-05 | Disposition: A | Discharge status: Home / Self Care | Payer: Medicare HMO | Attending: Urology | Admitting: Urology

## 2022-06-05 ENCOUNTER — Ambulatory Visit (HOSPITAL_COMMUNITY): Payer: Medicare HMO | Admitting: Anesthesiology

## 2022-06-05 DIAGNOSIS — N329 Bladder disorder, unspecified: Secondary | ICD-10-CM | POA: Diagnosis not present

## 2022-06-05 DIAGNOSIS — K219 Gastro-esophageal reflux disease without esophagitis: Secondary | ICD-10-CM | POA: Diagnosis not present

## 2022-06-05 DIAGNOSIS — R319 Hematuria, unspecified: Secondary | ICD-10-CM

## 2022-06-05 DIAGNOSIS — N3289 Other specified disorders of bladder: Secondary | ICD-10-CM | POA: Diagnosis not present

## 2022-06-05 HISTORY — PX: CYSTOSCOPY WITH FULGERATION: SHX6638

## 2022-06-05 HISTORY — PX: CYSTOSCOPY WITH BIOPSY: SHX5122

## 2022-06-05 SURGERY — CYSTOSCOPY, WITH BIOPSY
Anesthesia: General | Site: Bladder

## 2022-06-05 MED ORDER — CEFAZOLIN SODIUM-DEXTROSE 2-4 GM/100ML-% IV SOLN
2.0000 g | INTRAVENOUS | Status: AC
  Administered 2022-06-05: 2 g via INTRAVENOUS

## 2022-06-05 MED ORDER — STERILE WATER FOR IRRIGATION IR SOLN
Status: DC | PRN
  Administered 2022-06-05: 500 mL

## 2022-06-05 MED ORDER — PROPOFOL 10 MG/ML IV BOLUS
INTRAVENOUS | Status: AC
  Filled 2022-06-05: qty 20

## 2022-06-05 MED ORDER — DEXAMETHASONE SODIUM PHOSPHATE 10 MG/ML IJ SOLN
INTRAMUSCULAR | Status: AC
  Filled 2022-06-05: qty 1

## 2022-06-05 MED ORDER — CHLORHEXIDINE GLUCONATE 0.12 % MT SOLN
15.0000 mL | Freq: Once | OROMUCOSAL | Status: AC
  Administered 2022-06-05: 15 mL via OROMUCOSAL

## 2022-06-05 MED ORDER — WATER FOR IRRIGATION, STERILE IR SOLN
Status: DC | PRN
  Administered 2022-06-05: 3000 mL

## 2022-06-05 MED ORDER — ONDANSETRON HCL 4 MG/2ML IJ SOLN
INTRAMUSCULAR | Status: AC
  Filled 2022-06-05: qty 2

## 2022-06-05 MED ORDER — LACTATED RINGERS IV SOLN
INTRAVENOUS | Status: DC
Start: 2022-06-05 — End: 2022-06-05

## 2022-06-05 MED ORDER — HYDROMORPHONE HCL 1 MG/ML IJ SOLN: 0.2500 mg | INTRAMUSCULAR | Status: DC | PRN

## 2022-06-05 MED ORDER — PROPOFOL 10 MG/ML IV BOLUS
INTRAVENOUS | Status: DC | PRN
  Administered 2022-06-05: 70 mg via INTRAVENOUS
  Administered 2022-06-05: 200 mg via INTRAVENOUS

## 2022-06-05 MED ORDER — LIDOCAINE HCL (CARDIAC) PF 100 MG/5ML IV SOSY
PREFILLED_SYRINGE | INTRAVENOUS | Status: DC | PRN
  Administered 2022-06-05: 80 mg via INTRAVENOUS

## 2022-06-05 MED ORDER — ORAL CARE MOUTH RINSE: 15.0000 mL | Freq: Once | OROMUCOSAL | Status: AC

## 2022-06-05 MED ORDER — CEFAZOLIN SODIUM-DEXTROSE 2-4 GM/100ML-% IV SOLN
INTRAVENOUS | Status: AC
  Filled 2022-06-05: qty 100

## 2022-06-05 MED ORDER — FENTANYL CITRATE (PF) 100 MCG/2ML IJ SOLN
INTRAMUSCULAR | Status: AC
  Filled 2022-06-05: qty 2

## 2022-06-05 MED ORDER — ROCURONIUM BROMIDE 10 MG/ML (PF) SYRINGE
PREFILLED_SYRINGE | INTRAVENOUS | Status: AC
  Filled 2022-06-05: qty 10

## 2022-06-05 MED ORDER — MEPERIDINE HCL 50 MG/ML IJ SOLN: 6.2500 mg | INTRAMUSCULAR | Status: DC | PRN

## 2022-06-05 MED ORDER — HYDROCODONE-ACETAMINOPHEN 5-325 MG PO TABS
1.0000 | ORAL_TABLET | Freq: Four times a day (QID) | ORAL | 0 refills | Status: DC | PRN
Start: 2022-06-05 — End: 2022-08-26

## 2022-06-05 MED ORDER — ONDANSETRON HCL 4 MG/2ML IJ SOLN: 4.0000 mg | Freq: Once | INTRAMUSCULAR | Status: DC | PRN

## 2022-06-05 MED ORDER — DEXAMETHASONE SODIUM PHOSPHATE 10 MG/ML IJ SOLN
INTRAMUSCULAR | Status: DC | PRN
  Administered 2022-06-05: 10 mg via INTRAVENOUS

## 2022-06-05 MED ORDER — ONDANSETRON HCL 4 MG/2ML IJ SOLN
INTRAMUSCULAR | Status: DC | PRN
  Administered 2022-06-05: 4 mg via INTRAVENOUS

## 2022-06-05 MED ORDER — PHENYLEPHRINE HCL (PRESSORS) 10 MG/ML IV SOLN
INTRAVENOUS | Status: DC | PRN
  Administered 2022-06-05: 80 ug via INTRAVENOUS

## 2022-06-05 MED ORDER — LIDOCAINE HCL (PF) 2 % IJ SOLN
INTRAMUSCULAR | Status: AC
  Filled 2022-06-05: qty 5

## 2022-06-05 MED ORDER — PHENYLEPHRINE 80 MCG/ML (10ML) SYRINGE FOR IV PUSH (FOR BLOOD PRESSURE SUPPORT)
PREFILLED_SYRINGE | INTRAVENOUS | Status: AC
  Filled 2022-06-05: qty 10

## 2022-06-05 SURGICAL SUPPLY — 20 items
BAG DRAIN URO TABLE W/ADPT NS (BAG) ×2 IMPLANT
BAG HAMPER (MISCELLANEOUS) ×2 IMPLANT
CLOTH BEACON ORANGE TIMEOUT ST (SAFETY) ×2 IMPLANT
ELECT REM PT RETURN 9FT ADLT (ELECTROSURGICAL) ×2
ELECTRODE REM PT RTRN 9FT ADLT (ELECTROSURGICAL) ×1 IMPLANT
GLOVE BIO SURGEON STRL SZ8 (GLOVE) ×2 IMPLANT
GLOVE BIOGEL PI IND STRL 7.0 (GLOVE) ×2 IMPLANT
GLOVE BIOGEL PI INDICATOR 7.0 (GLOVE) ×2
GOWN STRL REUS W/TWL LRG LVL3 (GOWN DISPOSABLE) ×2 IMPLANT
GOWN STRL REUS W/TWL XL LVL3 (GOWN DISPOSABLE) ×2 IMPLANT
KIT TURNOVER CYSTO (KITS) ×2 IMPLANT
MANIFOLD NEPTUNE II (INSTRUMENTS) ×2 IMPLANT
NDL HYPO 18GX1.5 BLUNT FILL (NEEDLE) ×1 IMPLANT
NEEDLE HYPO 18GX1.5 BLUNT FILL (NEEDLE) ×2 IMPLANT
PACK CYSTO (CUSTOM PROCEDURE TRAY) ×2 IMPLANT
PAD ARMBOARD 7.5X6 YLW CONV (MISCELLANEOUS) ×2 IMPLANT
PAD TELFA 3X4 1S STER (GAUZE/BANDAGES/DRESSINGS) ×2 IMPLANT
TOWEL OR 17X26 4PK STRL BLUE (TOWEL DISPOSABLE) ×2 IMPLANT
WATER STERILE IRR 3000ML UROMA (IV SOLUTION) ×2 IMPLANT
WATER STERILE IRR 500ML POUR (IV SOLUTION) ×2 IMPLANT

## 2022-06-05 NOTE — Anesthesia Postprocedure Evaluation (Signed)
Anesthesia Post Note  Patient: Carolyn Raymond  Procedure(s) Performed: CYSTOSCOPY WITH BIOPSY (Bladder) CYSTOSCOPY WITH FULGERATION (Bladder)  Patient location during evaluation: Phase II Anesthesia Type: General Level of consciousness: awake and alert and oriented Pain management: pain level controlled Vital Signs Assessment: post-procedure vital signs reviewed and stable Respiratory status: spontaneous breathing, nonlabored ventilation and respiratory function stable Cardiovascular status: blood pressure returned to baseline and stable Postop Assessment: no apparent nausea or vomiting Anesthetic complications: no   No notable events documented.   Last Vitals:  Vitals:   06/05/22 0945 06/05/22 1005  BP: 104/69 123/88  Pulse: 71 66  Resp: (!) 21 (!) 21  Temp:  36.6 C  SpO2: 98% 97%    Last Pain:  Vitals:   06/05/22 1005  TempSrc: Oral  PainSc: 3                  Stiles Maxcy C Mat Stuard

## 2022-06-05 NOTE — Op Note (Signed)
Preoperative diagnosis: bladder lesion  Postoperative diagnosis: Same  Procedure: 1 cystoscopy 2.  Bladder biopsy with fulgeration  Attending: Wilkie Aye  Anesthesia: General  Estimated blood loss: Minimal  Drains: none  Specimens: posterior wall bladder biopsy  Antibiotics: ancef  Findings: 42mm posterior wall erythematous lesion. Ureteral orifices in normal anatomic location.   Indications: Patient is a 66 year old female with a history of a bladder lesion found on office cystoscopy.  After discussing treatment options, they decided proceed with bladder biopsy.  Procedure in detail: The patient was brought to the operating room and a brief timeout was done to ensure correct patient, correct procedure, correct site.  General anesthesia was administered patient was placed in dorsal lithotomy position.  Their genitalia was then prepped and draped in usual sterile fashion.  A rigid 22 French cystoscope was passed in the urethra and the bladder.  Bladder was inspected and we noted  a 80mm erythematous posterior wall bladder lesion.  the ureteral orifices were in the normal orthotopic locations.  We proceeded to obtain a biopsy with the biopsy forceps from the posterior wall where there was an area of erythema. Hemostasis was then obtained with a bugbee. the bladder was then drained and this concluded the procedure which was well tolerated by patient.  Complications: None  Condition: Stable, extubated, transferred to PACU  Plan: Patient will be discharged home and will followup in 5 days for a pathology discussion

## 2022-06-05 NOTE — Anesthesia Preprocedure Evaluation (Signed)
Anesthesia Evaluation  Patient identified by MRN, date of birth, ID band Patient awake    Reviewed: Allergy & Precautions, NPO status , Patient's Chart, lab work & pertinent test results  Airway Mallampati: III  TM Distance: >3 FB Neck ROM: Full    Dental  (+) Dental Advisory Given, Missing   Pulmonary neg pulmonary ROS,    Pulmonary exam normal breath sounds clear to auscultation       Cardiovascular negative cardio ROS Normal cardiovascular exam Rhythm:Regular Rate:Normal     Neuro/Psych negative neurological ROS  negative psych ROS   GI/Hepatic Neg liver ROS, GERD  Controlled,  Endo/Other  negative endocrine ROS  Renal/GU Renal disease  negative genitourinary   Musculoskeletal negative musculoskeletal ROS (+)   Abdominal   Peds negative pediatric ROS (+)  Hematology negative hematology ROS (+)   Anesthesia Other Findings   Reproductive/Obstetrics negative OB ROS                            Anesthesia Physical Anesthesia Plan  ASA: 2  Anesthesia Plan: General   Post-op Pain Management: Dilaudid IV   Induction: Intravenous  PONV Risk Score and Plan: 4 or greater and Ondansetron and Dexamethasone  Airway Management Planned: LMA  Additional Equipment:   Intra-op Plan:   Post-operative Plan: Extubation in OR  Informed Consent: I have reviewed the patients History and Physical, chart, labs and discussed the procedure including the risks, benefits and alternatives for the proposed anesthesia with the patient or authorized representative who has indicated his/her understanding and acceptance.     Dental advisory given  Plan Discussed with: CRNA and Surgeon  Anesthesia Plan Comments:         Anesthesia Quick Evaluation

## 2022-06-05 NOTE — Transfer of Care (Signed)
Immediate Anesthesia Transfer of Care Note  Patient: Carolyn Raymond  Procedure(s) Performed: CYSTOSCOPY WITH BIOPSY (Bladder) CYSTOSCOPY WITH FULGERATION (Bladder)  Patient Location: PACU  Anesthesia Type:General  Level of Consciousness: awake, alert  and oriented  Airway & Oxygen Therapy: Patient Spontanous Breathing and Patient connected to nasal cannula oxygen  Post-op Assessment: Report given to RN and Post -op Vital signs reviewed and stable  Post vital signs: Reviewed and stable  Last Vitals:  Vitals Value Taken Time  BP 87/59 06/05/22 0928  Temp 36.6 C 06/05/22 0928  Pulse 80 06/05/22 0930  Resp 16 06/05/22 0930  SpO2 96 % 06/05/22 0930  Vitals shown include unvalidated device data.  Last Pain:  Vitals:   06/05/22 0757  TempSrc: Oral  PainSc: 0-No pain      Patients Stated Pain Goal: 7 (06/05/22 0757)  Complications: No notable events documented.

## 2022-06-05 NOTE — Anesthesia Procedure Notes (Signed)
Procedure Name: LMA Insertion Date/Time: 06/05/2022 9:00 AM  Performed by: Molli Barrows, MDPre-anesthesia Checklist: Patient identified, Emergency Drugs available, Suction available and Patient being monitored Patient Re-evaluated:Patient Re-evaluated prior to induction Oxygen Delivery Method: Circle system utilized Preoxygenation: Pre-oxygenation with 100% oxygen Induction Type: IV induction Ventilation: Mask ventilation without difficulty LMA: LMA inserted LMA Size: 3.0 Number of attempts: 2 Placement Confirmation: positive ETCO2 Tube secured with: Tape Dental Injury: Teeth and Oropharynx as per pre-operative assessment  Comments: Inserted 4.0 LMA, no ETCO2, chest rise, removed LMA, mask ventilated, reattempt with 3.0 LMA, + chest rise, ETCO2, no trauma to mouth, teeth, lips, gums during insertion

## 2022-06-05 NOTE — Interval H&P Note (Signed)
History and Physical Interval Note:  06/05/2022 8:18 AM  Carolyn Raymond  has presented today for surgery, with the diagnosis of bladder lesion.  The various methods of treatment have been discussed with the patient and family. After consideration of risks, benefits and other options for treatment, the patient has consented to  Procedure(s): CYSTOSCOPY WITH BIOPSY (N/A) CYSTOSCOPY WITH FULGERATION (N/A) as a surgical intervention.  The patient's history has been reviewed, patient examined, no change in status, stable for surgery.  I have reviewed the patient's chart and labs.  Questions were answered to the patient's satisfaction.     Wilkie Aye

## 2022-06-06 LAB — SURGICAL PATHOLOGY

## 2022-06-09 ENCOUNTER — Encounter (HOSPITAL_COMMUNITY): Payer: Self-pay | Admitting: Urology

## 2022-06-11 ENCOUNTER — Ambulatory Visit: Payer: Medicare HMO | Admitting: Urology

## 2022-06-11 ENCOUNTER — Encounter: Payer: Self-pay | Admitting: Urology

## 2022-06-11 VITALS — BP 129/84 | HR 87

## 2022-06-11 DIAGNOSIS — N3289 Other specified disorders of bladder: Secondary | ICD-10-CM | POA: Diagnosis not present

## 2022-06-11 DIAGNOSIS — R319 Hematuria, unspecified: Secondary | ICD-10-CM

## 2022-06-11 MED ORDER — DIAZEPAM 5 MG PO TABS: 5.0000 mg | ORAL_TABLET | Freq: Once | ORAL | 0 refills | Status: AC

## 2022-06-11 NOTE — Progress Notes (Signed)
06/11/2022 11:14 AM   [name] May 02, 1956 [id number]  Referring provider: Gilmore Laroche, FNP [address] [place],  [place] [address]  Followup bladder biopsy   HPI: Ms Chay is a 30SH here for followup after bladder biopsy. Pathology benign. She denies any significant LUTS. No hematuria or dysuria. 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    Home Medications:  Allergies as of 06/11/2022       Reactions   Bee Pollen Swelling   Can always tell when they are around   Isovue [iopamidol] Itching, Swelling        Medication List        Accurate as of June 11, 2022 11:14 AM. If you have any questions, ask your nurse or doctor.          aspirin 325 MG tablet Take 325 mg by mouth daily as needed for headache.   CALCIUM PO Take 1,200 mg by mouth daily.   cetirizine 10 MG tablet Commonly known as: ZyrTEC Allergy Take 1 tablet (10 mg total) by mouth daily.   Garlic 1000 MG Caps Take 1,000 mg by mouth daily.   HYDROcodone-acetaminophen 5-325 MG tablet Commonly known as: Norco Take 1 tablet by mouth every 6 (six) hours as needed for moderate pain.   hydrocortisone cream 0.5 % Apply 1 Application topically 2 (two) times daily.   hydrOXYzine 25 MG  capsule Commonly known as: VISTARIL Take 1 capsule (25 mg total) by mouth every 8 (eight) hours as needed. What changed: reasons to take this   ondansetron 4 MG disintegrating tablet Commonly known as: ZOFRAN-ODT Take 1 tablet (4 mg total) by mouth every 8 (eight) hours as needed for nausea or vomiting.   rosuvastatin 20 MG tablet Commonly known as: Crestor Take 1 tablet (20 mg total) by mouth daily. What changed: when to take this   VITAMIN A PO Take 2,400 mg by mouth 4 (four) times a week.   VITAMIN C PO Take by mouth daily.   Vitamin D-3 25 MCG (1000 UT) Caps Take 1,000 Units by mouth daily.        Allergies:  Allergies  Allergen Reactions   Bee Pollen Swelling    Can always tell when they are around   Isovue [Iopamidol] Itching and Swelling    Family History: No family history on file.  Social History:  reports that she has never smoked. She has never used smokeless tobacco. She reports that she does not drink alcohol and does not use drugs.  ROS: All other review of systems were reviewed and are negative except what is noted above in HPI  Physical Exam: BP 129/84   Pulse 87   Constitutional:  Alert and oriented, No acute distress.  HEENT: [place] AT, moist mucus membranes.  Trachea midline, no masses. Cardiovascular: No clubbing, cyanosis, or edema. Respiratory: Normal respiratory effort, no increased work of breathing. GI: Abdomen is soft, nontender, nondistended, no abdominal masses GU: No CVA tenderness.  Lymph: No cervical or inguinal lymphadenopathy. Skin: No rashes, bruises or suspicious lesions. Neurologic: Grossly intact, no focal deficits, moving all 4 extremities. Psychiatric: Normal mood and affect.  Laboratory Data: Lab Results  Component Value Date   WBC 7.9 03/12/2022   HGB 13.3 03/12/2022   HCT 40.7 03/12/2022   MCV 93.3 03/12/2022   PLT 284 03/12/2022    Lab Results  Component Value Date   CREATININE 0.81 03/12/2022    No results  found for: "PSA"  No results found for: "TESTOSTERONE"  Lab Results  Component Value Date   HGBA1C 5.6 06/03/2022    Urinalysis    Component Value Date/Time   COLORURINE YELLOW 03/12/2022 1009   APPEARANCEUR Clear 05/16/2022 1052   LABSPEC 1.018 03/12/2022 1009   PHURINE 5.0 03/12/2022 1009   GLUCOSEU Negative 05/16/2022 1052   HGBUR MODERATE (A) 03/12/2022 1009   BILIRUBINUR Negative 05/16/2022 1052   KETONESUR negative 03/28/2022 1612   KETONESUR NEGATIVE 03/12/2022 1009   PROTEINUR Negative 05/16/2022 1052   PROTEINUR NEGATIVE 03/12/2022 1009   UROBILINOGEN 0.2 03/28/2022 1612   UROBILINOGEN 1 07/04/2013 1539   NITRITE Negative 05/16/2022 1052   NITRITE NEGATIVE 03/12/2022 1009   LEUKOCYTESUR Negative 05/16/2022 1052   LEUKOCYTESUR NEGATIVE 03/12/2022 1009    Lab Results  Component Value Date   LABMICR See below: 05/16/2022   WBCUA 0-5 05/16/2022   LABEPIT 0-10 05/16/2022   BACTERIA Few (A) 05/16/2022    Pertinent Imaging:  No results found for this or any previous visit.  No results found for this or any previous visit.  No results found for this or any previous visit.  No results found for this or any previous visit.  No results found for this or any previous visit.  No results found for this or any previous visit.  No results found for this or any previous visit.  No results found for this or any previous visit.   Assessment & Plan:    1. Benign bladder lesions RTC 3 months with cystoscopy.   No follow-ups on file.  Wilkie Aye, MD  [facility] [place]

## 2022-06-11 NOTE — Patient Instructions (Signed)

## 2022-08-11 ENCOUNTER — Encounter: Payer: Self-pay | Admitting: Family Medicine

## 2022-08-11 ENCOUNTER — Ambulatory Visit (INDEPENDENT_AMBULATORY_CARE_PROVIDER_SITE_OTHER): Payer: Medicare HMO | Admitting: Family Medicine

## 2022-08-11 VITALS — BP 120/62 | HR 88 | Ht 64.0 in | Wt 163.0 lb

## 2022-08-11 DIAGNOSIS — Z23 Encounter for immunization: Secondary | ICD-10-CM

## 2022-08-11 DIAGNOSIS — Z2821 Immunization not carried out because of patient refusal: Secondary | ICD-10-CM | POA: Diagnosis not present

## 2022-08-11 DIAGNOSIS — N950 Postmenopausal bleeding: Secondary | ICD-10-CM | POA: Diagnosis not present

## 2022-08-11 NOTE — Progress Notes (Signed)
Acute Office Visit  Subjective:     Patient ID: Carolyn Raymond, female    DOB: September 16, 1956, 66 y.o.   MRN: [id number]  Chief Complaint  Patient presents with   Vaginal Bleeding    Vaginal bleeding started 08/11/22 on and off .    HPI The patient is in today for vaginal bleeding that started on 08/10/22. She reports that bleeding occurs after wiping with a small clot reported on 08/11/22. She notes that bleeding is heavier than spotting and lighter than menstrual flow. She denies pain, fever, bladder and bowel movement changes, HRT, and anticoagulant. She denies colon and endometiral cancer. She denies pelvic pain, vaginal dryness, soreness, and pain with intercourse.  Review of Systems  Constitutional:  Negative for chills and fever.  Gastrointestinal:  Negative for blood in stool, constipation and diarrhea.  Genitourinary:  Negative for frequency and urgency.       Vaginal bleeding  Neurological:  Negative for dizziness and headaches.  Psychiatric/Behavioral:  Negative for suicidal ideas. The patient does not have insomnia.         Objective:    BP 120/62   Pulse 88   Ht 5\' 4"  (1.626 m)   Wt 163 lb (73.9 kg)   SpO2 95%   BMI 27.98 kg/m    Physical Exam HENT:     Head: Normocephalic.  Cardiovascular:     Rate and Rhythm: Normal rate and regular rhythm.     Pulses: Normal pulses.     Heart sounds: Normal heart sounds.  Pulmonary:     Effort: Pulmonary effort is normal.     Breath sounds: Normal breath sounds.  Neurological:     Mental Status: She is alert.     No results found for any visits on 08/11/22.      Assessment & Plan:   Problem List Items Addressed This Visit       Other   PMB (postmenopausal bleeding) - Primary    The patient is in today for vaginal bleeding that started on 08/10/22 She reports that bleeding occurs after wiping with a small clot reported on 08/11/22 She notes that bleeding is heavier than spotting and lighter than menstrual  flow She denies pain, fever, bladder and bowel movement changes, HRT, and anticoagulant. She denies colon and endometiral cancer She denies pelvic pain, vaginal dryness, soreness, and pain with intercourse Referral placed to GYN to r/o  endometrial hyperplasia/carcinoma Pending CBC      Relevant Orders   CBC with Differential/Platelet   Ambulatory referral to Gynecology   Need for immunization with diphtheria, tetanus, and poliovirus vaccine    Patient educated on CDC recommendation for the vaccine. Verbal consent was obtained from the patient, vaccine administered by nurse, no sign of adverse reactions noted at this time. Patient education on arm soreness and use of tylenol or ibuprofen for this patient  was discussed. Patient educated on the signs and symptoms of adverse effect and advise to contact the office if they occur.      Other Visit Diagnoses     Refused influenza vaccine       Pneumococcal vaccination declined       Immunization due       Relevant Orders   Tdap vaccine greater than or equal to 7yo IM (Completed)       No orders of the defined types were placed in this encounter.   Return if symptoms worsen or fail to improve.  Alvira Monday, FNP

## 2022-08-11 NOTE — Assessment & Plan Note (Signed)
Patient educated on CDC recommendation for the vaccine. Verbal consent was obtained from the patient, vaccine administered by nurse, no sign of adverse reactions noted at this time. Patient education on arm soreness and use of tylenol or ibuprofen for this patient  was discussed. Patient educated on the signs and symptoms of adverse effect and advise to contact the office if they occur.  

## 2022-08-11 NOTE — Patient Instructions (Addendum)
I appreciate the opportunity to provide care to you today!    Follow up:  09/22/22  Labs: please stop by the lab today to get your blood drawn (CBC)    Referrals today- GYN   Please continue to a heart-healthy diet and increase your physical activities. Try to exercise for 49mins at least three times a week.      It was a pleasure to see you and I look forward to continuing to work together on your health and well-being. Please do not hesitate to call the office if you need care or have questions about your care.   Have a wonderful day and week. With Gratitude, Alvira Monday MSN, FNP-BC

## 2022-08-11 NOTE — Assessment & Plan Note (Signed)
The patient is in today for vaginal bleeding that started on 08/10/22 She reports that bleeding occurs after wiping with a small clot reported on 08/11/22 She notes that bleeding is heavier than spotting and lighter than menstrual flow She denies pain, fever, bladder and bowel movement changes, HRT, and anticoagulant. She denies colon and endometiral cancer She denies pelvic pain, vaginal dryness, soreness, and pain with intercourse Referral placed to GYN to r/o  endometrial hyperplasia/carcinoma Pending CBC

## 2022-08-11 NOTE — Progress Notes (Signed)
vag

## 2022-08-12 LAB — CBC WITH DIFFERENTIAL/PLATELET
Basophils Absolute: 0 10*3/uL (ref 0.0–0.2)
Basos: 0 %
EOS (ABSOLUTE): 0.1 10*3/uL (ref 0.0–0.4)
Eos: 1 %
Hematocrit: 40 % (ref 34.0–46.6)
Hemoglobin: 13.2 g/dL (ref 11.1–15.9)
Immature Grans (Abs): 0 10*3/uL (ref 0.0–0.1)
Immature Granulocytes: 0 %
Lymphocytes Absolute: 3.1 10*3/uL (ref 0.7–3.1)
Lymphs: 37 %
MCH: 30.1 pg (ref 26.6–33.0)
MCHC: 33 g/dL (ref 31.5–35.7)
MCV: 91 fL (ref 79–97)
Monocytes Absolute: 0.6 10*3/uL (ref 0.1–0.9)
Monocytes: 7 %
Neutrophils Absolute: 4.5 10*3/uL (ref 1.4–7.0)
Neutrophils: 55 %
Platelets: 273 10*3/uL (ref 150–450)
RBC: 4.38 x10E6/uL (ref 3.77–5.28)
RDW: 12.3 % (ref 11.7–15.4)
WBC: 8.4 10*3/uL (ref 3.4–10.8)

## 2022-08-12 NOTE — Progress Notes (Signed)
Please inform the patient that her hemoglobin is within normal limits and her labs are stable

## 2022-08-26 ENCOUNTER — Ambulatory Visit: Payer: Medicare HMO | Admitting: Obstetrics & Gynecology

## 2022-08-26 ENCOUNTER — Encounter: Payer: Self-pay | Admitting: Obstetrics & Gynecology

## 2022-08-26 VITALS — BP 131/90 | HR 89 | Ht 64.5 in | Wt 164.0 lb

## 2022-08-26 DIAGNOSIS — N950 Postmenopausal bleeding: Secondary | ICD-10-CM | POA: Diagnosis not present

## 2022-08-26 DIAGNOSIS — F418 Other specified anxiety disorders: Secondary | ICD-10-CM | POA: Diagnosis not present

## 2022-08-26 MED ORDER — DIAZEPAM 5 MG PO TABS: ORAL_TABLET | ORAL | 0 refills | Status: DC

## 2022-08-26 NOTE — Progress Notes (Signed)
   GYN VISIT Patient name: Carolyn Raymond MRN [id number]  Date of birth: [birth year] Chief Complaint:   post menopausal bleeding  History of Present Illness:   Carolyn Raymond is a 66 y.o. [phone] female being seen today for the following concerning:  -PMB: Noted bleeding 9/24- noted BRB similar to a period that lasted for more than a week with small clots and noted some right-sided pelvic pain.  Typically wearing 2 pads per day, light pad. Eventually the bleeding went away on its own.  She denies any further bleeding.  Denies pelvic or abdominal pain currently.  Denies urinary or bowel concerns- no other acute complaints.  Records reviewed- no prior pelvic US.  MRI abd/pelvic- normal appearing reproductive organs Prior hematuria- work up completed including cystoscopy and imaging- benign findings No LMP recorded. Patient is postmenopausal.     08/26/2022    1:34 PM 08/11/2022    2:31 PM 05/27/2022   11:40 AM 05/01/2022    9:29 AM 05/01/2022    9:26 AM  Depression screen PHQ 2/9  Decreased Interest 0 0 0 0 0  Down, Depressed, Hopeless 0 0 0 0 0  PHQ - 2 Score 0 0 0 0 0  Altered sleeping 0      Tired, decreased energy 0      Change in appetite 0      Feeling bad or failure about yourself  0      Trouble concentrating 0      Moving slowly or fidgety/restless 0      Suicidal thoughts 0      PHQ-9 Score 0         Review of Systems:   Pertinent items are noted in HPI Denies fever/chills, dizziness, headaches, visual disturbances, fatigue, shortness of breath, chest pain, abdominal pain, vomiting. Pertinent History Reviewed:  Reviewed past medical,surgical, social, obstetrical and family history.  Reviewed problem list, medications and allergies. Physical Assessment:   Vitals:   08/26/22 1345  BP: (!) 131/90  Pulse: 89  Weight: 164 lb (74.4 kg)  Height: 5' 4.5" (1.638 m)  Body mass index is 27.72 kg/m.       Physical Examination:   General appearance: alert, well  appearing, and in no distress  Psych: mood appropriate, normal affect  Skin: warm & dry   Cardiovascular:RRR  Respiratory: CTAB  Abdomen: soft, non-tender, no rebound, no guarding  Pelvic: to be completed at next visit  Extremities: no edema, no calf tenderness bilaterally  Chaperone: N/A    Assessment & Plan:  1) Postmenopausal bleeding -reviewed potential etiology ranging from atropy to endometrial cancer -reviewed work up including pelvic US and EMB -based on history would advise EMB.  Pt very concerned about pain during exam as she noted considerable pain during cystoscopy -pt offered premedication with driver.  Pt agreeable to this and understand she cannot drive herself home  Meds ordered this encounter  Medications   diazepam (VALIUM) 5 MG tablet    Sig: Take one pill about 30 minutes prior to appointment    Dispense:  1 tablet    Refill:  0      Return for reschedule for EMB.   Janyth Pupa, DO Attending [place], [facility] for [employer], [place]

## 2022-08-27 ENCOUNTER — Other Ambulatory Visit: Payer: Medicare HMO | Admitting: Urology

## 2022-09-02 ENCOUNTER — Encounter: Payer: Self-pay | Admitting: *Deleted

## 2022-09-04 ENCOUNTER — Telehealth: Payer: Self-pay | Admitting: *Deleted

## 2022-09-04 NOTE — Telephone Encounter (Signed)
I called [facility] in [place] to check on the cash price of Diazepam because I have a PA for that med. Pt has picked up med so no need for PA. JSY

## 2022-09-09 ENCOUNTER — Other Ambulatory Visit: Payer: Medicare HMO | Admitting: Urology

## 2022-09-15 ENCOUNTER — Encounter: Payer: Self-pay | Admitting: Obstetrics & Gynecology

## 2022-09-15 ENCOUNTER — Other Ambulatory Visit (HOSPITAL_COMMUNITY)
Admission: RE | Admit: 2022-09-15 | Discharge: 2022-09-15 | Disposition: A | Discharge status: Home / Self Care | Payer: Medicare HMO | Source: Ambulatory Visit | Attending: Obstetrics & Gynecology | Admitting: Obstetrics & Gynecology

## 2022-09-15 ENCOUNTER — Ambulatory Visit: Payer: Medicare HMO | Admitting: Obstetrics & Gynecology

## 2022-09-15 VITALS — BP 128/85 | HR 100 | Ht 64.5 in | Wt 162.4 lb

## 2022-09-15 DIAGNOSIS — N950 Postmenopausal bleeding: Secondary | ICD-10-CM | POA: Insufficient documentation

## 2022-09-15 NOTE — Progress Notes (Signed)
GYN VISIT Patient name: Carolyn Raymond MRN [id number]  Date of birth: 01-25-1956 Chief Complaint:   Procedure (Endo bx)  History of Present Illness:   Carolyn Raymond is a 66 y.o. [phone] PM female being seen today for the following concerns:   -PMB: In review- she had bleeding 9/24 that lasted over a week.  She was wearing 2 pads per day- light.  Bleed had resolved and she had gone about 3 weeks without bleeding.   This am noted pink spotting.  Denies pelvic or abdominal pain.  Denies irregular discharge or irritation.  No other acute complaints.  Pt took valium for today's appt- husband drove her today.  No LMP recorded. Patient is postmenopausal.     08/26/2022    1:34 PM 08/11/2022    2:31 PM 05/27/2022   11:40 AM 05/01/2022    9:29 AM 05/01/2022    9:26 AM  Depression screen PHQ 2/9  Decreased Interest 0 0 0 0 0  Down, Depressed, Hopeless 0 0 0 0 0  PHQ - 2 Score 0 0 0 0 0  Altered sleeping 0      Tired, decreased energy 0      Change in appetite 0      Feeling bad or failure about yourself  0      Trouble concentrating 0      Moving slowly or fidgety/restless 0      Suicidal thoughts 0      PHQ-9 Score 0         Review of Systems:   Pertinent items are noted in HPI Denies fever/chills, dizziness, headaches, visual disturbances, fatigue, shortness of breath, chest pain, abdominal pain, vomiting. Pertinent History Reviewed:  Reviewed past medical,surgical, social, obstetrical and family history.  Reviewed problem list, medications and allergies. Physical Assessment:   Vitals:   09/15/22 1452  BP: 128/85  Pulse: 100  Weight: 162 lb 6.4 oz (73.7 kg)  Height: 5' 4.5" (1.638 m)  Body mass index is 27.45 kg/m.       Physical Examination:   General appearance: alert, well appearing, and in no distress  Psych: mood appropriate, normal affect  Skin: warm & dry   Cardiovascular: normal heart rate noted  Respiratory: normal respiratory effort, no  distress  Abdomen: soft, non-tender, no rebound, no guarding  Pelvic: VULVA: normal appearing vulva with no masses, tenderness or lesions, VAGINA: normal appearing vagina with normal color and discharge, no lesions, CERVIX: normal appearing cervix without discharge or lesions  Extremities: no edema   Chaperone: Faith Rogue   Endometrial Biopsy Procedure Note  Pre-operative Diagnosis: postmenopausal bleeding  Post-operative Diagnosis: same  Procedure Details  The risks (including infection, bleeding, pain, and uterine perforation) and benefits of the procedure were explained to the patient and Written informed consent was obtained.  Antibiotic prophylaxis against endocarditis was not indicated.   The patient was placed in the dorsal lithotomy position.  Bimanual exam showed the uterus to be in the neutral position.  A speculum inserted in the vagina, and the cervix prepped with betadine.     A single tooth tenaculum was applied to the anterior lip of the cervix for stabilization.  A Pipelle endometrial aspirator was used to sample the endometrium.  Sample was sent for pathologic examination.  Condition: Stable  Complications: None  Assessment & Plan:  1) Postmenopausal bleeding Next step pending results of pathology.   The patient was advised to call for any fever or for prolonged or  severe pain or bleeding. She was advised to use OTC analgesics as needed for mild to moderate pain. She was advised to avoid vaginal intercourse for 48 hours or until the bleeding has completely stopped.   Return in about 1 year (around 09/16/2023) for Annual.   Janyth Pupa, DO Attending [place], [facility] for [facility], [place]

## 2022-09-17 LAB — SURGICAL PATHOLOGY

## 2022-09-22 ENCOUNTER — Ambulatory Visit (INDEPENDENT_AMBULATORY_CARE_PROVIDER_SITE_OTHER): Payer: Medicare HMO | Admitting: Family Medicine

## 2022-09-22 ENCOUNTER — Encounter: Payer: Self-pay | Admitting: Family Medicine

## 2022-09-22 VITALS — BP 136/82 | HR 94 | Ht 64.5 in | Wt 162.1 lb

## 2022-09-22 DIAGNOSIS — E7849 Other hyperlipidemia: Secondary | ICD-10-CM | POA: Diagnosis not present

## 2022-09-22 DIAGNOSIS — E559 Vitamin D deficiency, unspecified: Secondary | ICD-10-CM

## 2022-09-22 DIAGNOSIS — E038 Other specified hypothyroidism: Secondary | ICD-10-CM | POA: Diagnosis not present

## 2022-09-22 DIAGNOSIS — R7301 Impaired fasting glucose: Secondary | ICD-10-CM

## 2022-09-22 DIAGNOSIS — N950 Postmenopausal bleeding: Secondary | ICD-10-CM | POA: Diagnosis not present

## 2022-09-22 NOTE — Progress Notes (Signed)
Established Patient Office Visit  Subjective:  Patient ID: Carolyn Raymond, female    DOB: 06/19/1956  Age: 66 y.o. MRN: [id number]  CC:  Chief Complaint  Patient presents with   Follow-up    Pt states she has not received results from testing on 10/30.    HPI Carolyn Raymond is a 66 y.o. female with past medical history of allergic rhinitis, GERD, postmenopausal bleeding, and  hyperlipidemia presents for f/u of  chronic medical conditions.  Hyperlipidemia: She takes rosuvastatin 20 mg twice weekly.  She reports cutting back on her daily regimen due to postmenopausal bleeding.  She denies muscle aches, constipation, bloating, and headaches.  Postmenopausal bleeding: She followed up with GYN and had an endometrial biopsy on 09/15/2022.  She is inquiring about the results of the biopsy.  She reports that she has not had any post-menopausal bleeding since the procedure on 09/15/2022.   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    History reviewed. No pertinent family history.  Social History   Socioeconomic History   Marital status: Married    Spouse name: Not on file   Number of children: Not on file   Years of education: Not on file   Highest education level: Not on file  Occupational History   Not on file  Tobacco Use   Smoking  status: Never   Smokeless tobacco: Never  Vaping Use   Vaping Use: Never used  Substance and Sexual Activity   Alcohol use: Yes    Comment: occasionally   Drug use: No   Sexual activity: Yes    Birth control/protection: Post-menopausal  Other Topics Concern   Not on file  Social History Narrative   Not on file   Social Determinants of Health   Financial Resource Strain: Low Risk  (08/26/2022)   Overall Financial Resource Strain (CARDIA)    Difficulty of Paying Living Expenses: Not hard at all  Food Insecurity: No Food Insecurity (08/26/2022)   Hunger Vital Sign    Worried About Running Out of Food in the Last Year: Never true    [place] in the Last Year: Never true  Transportation Needs: No Transportation Needs (08/26/2022)   PRAPARE - Hydrologist (Medical): No    Lack of Transportation (Non-Medical): No  Physical Activity: Insufficiently Active (08/26/2022)   Exercise Vital Sign    Days of Exercise per Week: 1 day    Minutes of Exercise per Session: 30 min  Stress: No Stress Concern Present (08/26/2022)   [place]    Feeling of Stress : Only a little  Social Connections: Socially Integrated (08/26/2022)   Social Connection and Isolation Panel [NHANES]    Frequency of Communication with Friends  and Family: More than three times a week    Frequency of Social Gatherings with Friends and Family: Twice a week    Attends Religious Services: More than 4 times per year    Active Member of [employer] or Organizations: Yes    Attends Archivist Meetings: 1 to 4 times per year    Marital Status: Married  Human resources officer Violence: Not At Risk (08/26/2022)   Humiliation, Afraid, Rape, and Kick questionnaire    Fear of Current or Ex-Partner: No    Emotionally Abused: No    Physically Abused: No    Sexually Abused: No    Outpatient Medications Prior to Visit  Medication  Sig Dispense Refill   Ascorbic Acid (VITAMIN C PO) Take by mouth daily.     aspirin 325 MG tablet Take 325 mg by mouth daily as needed for headache.     CALCIUM PO Take 1,200 mg by mouth daily.     cetirizine (ZYRTEC ALLERGY) 10 MG tablet Take 1 tablet (10 mg total) by mouth daily. 30 tablet 0   Cholecalciferol (VITAMIN D-3) 25 MCG (1000 UT) CAPS Take 1,000 Units by mouth daily.     diazepam (VALIUM) 5 MG tablet Take one pill about 30 minutes prior to appointment 1 tablet 0   ferrous sulfate 324 (65 Fe) MG TBEC Take by mouth.     Garlic 4401 MG CAPS Take 1,000 mg by mouth daily.     hydrocortisone cream 0.5 % Apply 1 Application topically 2 (two) times daily. 30 g 0   hydrOXYzine (VISTARIL) 25 MG capsule Take 1 capsule (25 mg total) by mouth every 8 (eight) hours as needed. 30 capsule 0   ondansetron (ZOFRAN-ODT) 4 MG disintegrating tablet Take 1 tablet (4 mg total) by mouth every 8 (eight) hours as needed for nausea or vomiting. 20 tablet 0   rosuvastatin (CRESTOR) 20 MG tablet Take 1 tablet (20 mg total) by mouth daily. (Patient taking differently: Take 20 mg by mouth 2 (two) times a week.) 90 tablet 3   VITAMIN A PO Take 2,400 mg by mouth 4 (four) times a week.     No facility-administered medications prior to visit.    Allergies  Allergen Reactions   Bee Pollen Swelling    Can always tell when they are around   Isovue [Iopamidol] Itching and Swelling    ROS Review of Systems  Constitutional:  Negative for fatigue and fever.  Eyes:  Negative for visual disturbance.  Respiratory:  Negative for chest tightness and shortness of breath.   Cardiovascular:  Negative for chest pain and palpitations.  Genitourinary:  Negative for pelvic pain, vaginal bleeding and vaginal pain.      Objective:    Physical Exam HENT:     Head: Normocephalic.     Mouth/Throat:     Mouth: Mucous membranes are moist.  Cardiovascular:     Rate and Rhythm: Normal rate and regular rhythm.     Pulses:  Normal pulses.     Heart sounds: Normal heart sounds.  Pulmonary:     Effort: Pulmonary effort is normal.     Breath sounds: Normal breath sounds.  Neurological:     Mental Status: She is alert.     BP 136/82   Pulse 94   Ht 5' 4.5" (1.638 m)   Wt 162 lb 1.3 oz (73.5 kg)   SpO2 98%   BMI 27.39 kg/m  Wt Readings from Last 3 Encounters:  09/22/22 162 lb  1.3 oz (73.5 kg)  09/15/22 162 lb 6.4 oz (73.7 kg)  08/26/22 164 lb (74.4 kg)    Lab Results  Component Value Date   TSH 0.578 03/21/2022   Lab Results  Component Value Date   WBC 8.4 08/11/2022   HGB 13.2 08/11/2022   HCT 40.0 08/11/2022   MCV 91 08/11/2022   PLT 273 08/11/2022   Lab Results  Component Value Date   NA 138 03/12/2022   K 3.7 03/12/2022   CO2 26 03/12/2022   GLUCOSE 112 (H) 03/12/2022   BUN 12 03/12/2022   CREATININE 0.81 03/12/2022   BILITOT 0.4 07/30/2016   ALKPHOS 105 07/30/2016   AST 16 07/30/2016   ALT 9 07/30/2016   PROT 7.6 07/30/2016   ALBUMIN 3.8 07/30/2016   CALCIUM 9.2 03/12/2022   ANIONGAP 8 03/12/2022   Lab Results  Component Value Date   CHOL 303 (H) 03/21/2022   Lab Results  Component Value Date   HDL 74 03/21/2022   Lab Results  Component Value Date   LDLCALC 219 (H) 03/21/2022   Lab Results  Component Value Date   TRIG 66 03/21/2022   Lab Results  Component Value Date   CHOLHDL 4.1 03/21/2022   Lab Results  Component Value Date   HGBA1C 5.6 06/03/2022      Assessment & Plan:   Problem List Items Addressed This Visit       Other   Hyperlipidemia    Encouraged to take rosuvastatin 20 mg daily She denies muscle aches, constipation, and headaches Will get basic labs today including her lipid panel Lab Results  Component Value Date   CHOL 303 (H) 03/21/2022   HDL 74 03/21/2022   [place] 219 (H) 03/21/2022   TRIG 66 03/21/2022   CHOLHDL 4.1 03/21/2022         Relevant Orders   CBC with Differential/Platelet   CMP14+EGFR   Lipid Profile    Vitamin D deficiency   Relevant Orders   Vitamin D (25 hydroxy)   PMB (postmenopausal bleeding) - Primary    I informed the patient that her biopsy was negative for endometrial malignancy She denies any recent episode of vaginal bleeding and dyspareunia      Other Visit Diagnoses     Other specified hypothyroidism       Relevant Orders   TSH + free T4   IFG (impaired fasting glucose)       Relevant Orders   Hemoglobin A1C       No orders of the defined types were placed in this encounter.   Follow-up: Return in about 3 months (around 12/23/2022).    Alvira Monday, FNP

## 2022-09-22 NOTE — Assessment & Plan Note (Signed)
Encouraged to take rosuvastatin 20 mg daily She denies muscle aches, constipation, and headaches Will get basic labs today including her lipid panel Lab Results  Component Value Date   CHOL 303 (H) 03/21/2022   HDL 74 03/21/2022   LDLCALC 219 (H) 03/21/2022   TRIG 66 03/21/2022   CHOLHDL 4.1 03/21/2022

## 2022-09-22 NOTE — Assessment & Plan Note (Signed)
I informed the patient that her biopsy was negative for endometrial malignancy She denies any recent episode of vaginal bleeding and dyspareunia

## 2022-09-22 NOTE — Patient Instructions (Signed)
I appreciate the opportunity to provide care to you today!    Follow up:  3 months  Labs: please stop by the lab today to get your blood drawn (CBC, CMP, TSH, Lipid profile, HgA1c, Vit D)    Please continue to a heart-healthy diet and increase your physical activities. Try to exercise for 30mins at least three times a week.      It was a pleasure to see you and I look forward to continuing to work together on your health and well-being. Please do not hesitate to call the office if you need care or have questions about your care.   Have a wonderful day and week. With Gratitude, Ustin Cruickshank MSN, FNP-BC  

## 2022-09-23 LAB — LIPID PANEL
Chol/HDL Ratio: 3.7 ratio (ref 0.0–4.4)
Cholesterol, Total: 258 mg/dL — ABNORMAL HIGH (ref 100–199)
HDL: 70 mg/dL (ref >39)
LDL Chol Calc (NIH): 175 mg/dL — ABNORMAL HIGH (ref 0–99)
Triglycerides: 79 mg/dL (ref 0–149)
VLDL Cholesterol Cal: 13 mg/dL (ref 5–40)

## 2022-09-23 LAB — CMP14+EGFR
ALT: 10 IU/L (ref 0–32)
AST: 15 IU/L (ref 0–40)
Albumin/Globulin Ratio: 1.2 (ref 1.2–2.2)
Albumin: 4.1 g/dL (ref 3.9–4.9)
Alkaline Phosphatase: 143 IU/L — ABNORMAL HIGH (ref 44–121)
BUN/Creatinine Ratio: 14 (ref 12–28)
BUN: 13 mg/dL (ref 8–27)
Bilirubin Total: 0.5 mg/dL (ref 0.0–1.2)
CO2: 25 mmol/L (ref 20–29)
Calcium: 9.4 mg/dL (ref 8.7–10.3)
Chloride: 101 mmol/L (ref 96–106)
Creatinine, Ser: 0.91 mg/dL (ref 0.57–1.00)
Globulin, Total: 3.5 g/dL (ref 1.5–4.5)
Glucose: 92 mg/dL (ref 70–99)
Potassium: 4.5 mmol/L (ref 3.5–5.2)
Sodium: 139 mmol/L (ref 134–144)
Total Protein: 7.6 g/dL (ref 6.0–8.5)
eGFR: 70 mL/min/{1.73_m2} (ref >59)

## 2022-09-23 LAB — CBC WITH DIFFERENTIAL/PLATELET
Basophils Absolute: 0 10*3/uL (ref 0.0–0.2)
Basos: 0 %
EOS (ABSOLUTE): 0.1 10*3/uL (ref 0.0–0.4)
Eos: 1 %
Hematocrit: 42.4 % (ref 34.0–46.6)
Hemoglobin: 14.1 g/dL (ref 11.1–15.9)
Immature Grans (Abs): 0 10*3/uL (ref 0.0–0.1)
Immature Granulocytes: 0 %
Lymphocytes Absolute: 2 10*3/uL (ref 0.7–3.1)
Lymphs: 29 %
MCH: 30 pg (ref 26.6–33.0)
MCHC: 33.3 g/dL (ref 31.5–35.7)
MCV: 90 fL (ref 79–97)
Monocytes Absolute: 0.5 10*3/uL (ref 0.1–0.9)
Monocytes: 8 %
Neutrophils Absolute: 4.1 10*3/uL (ref 1.4–7.0)
Neutrophils: 62 %
Platelets: 274 10*3/uL (ref 150–450)
RBC: 4.7 x10E6/uL (ref 3.77–5.28)
RDW: 12.2 % (ref 11.7–15.4)
WBC: 6.7 10*3/uL (ref 3.4–10.8)

## 2022-09-23 LAB — HEMOGLOBIN A1C
Est. average glucose Bld gHb Est-mCnc: 120 mg/dL
Hgb A1c MFr Bld: 5.8 % — ABNORMAL HIGH (ref 4.8–5.6)

## 2022-09-23 LAB — VITAMIN D 25 HYDROXY (VIT D DEFICIENCY, FRACTURES): Vit D, 25-Hydroxy: 36.8 ng/mL (ref 30.0–100.0)

## 2022-09-23 LAB — TSH+FREE T4
Free T4: 1 ng/dL (ref 0.82–1.77)
TSH: 0.366 u[IU]/mL — ABNORMAL LOW (ref 0.450–4.500)

## 2022-09-25 ENCOUNTER — Other Ambulatory Visit: Payer: Self-pay | Admitting: Family Medicine

## 2022-09-25 DIAGNOSIS — E0590 Thyrotoxicosis, unspecified without thyrotoxic crisis or storm: Secondary | ICD-10-CM

## 2022-09-25 DIAGNOSIS — E7849 Other hyperlipidemia: Secondary | ICD-10-CM

## 2022-09-25 MED ORDER — EZETIMIBE 10 MG PO TABS: 10.0000 mg | ORAL_TABLET | Freq: Every day | ORAL | 3 refills | Status: DC

## 2022-09-25 NOTE — Progress Notes (Signed)
Please encourage the patient to take her rosuvastatin 20 mg daily rather than 2 times a week.  I have also added ezetimibe 10 mg daily to her treatment regimen for her cholesterol.  The medication is sent to her pharmacy to start taking.  Her cholesterol levels are elevated.  She is prediabetic, and I recommend decreasing her intake of sugary foods with increased physical activity, and a low-carbs and fat diet.  I would like her to return for labs to assess her thyroid levels on 11/06/2022.  Her thyroid levels were mild hypothyroidism.

## 2022-11-06 DIAGNOSIS — E039 Hypothyroidism, unspecified: Secondary | ICD-10-CM | POA: Diagnosis not present

## 2022-11-07 ENCOUNTER — Telehealth: Payer: Self-pay | Admitting: Family Medicine

## 2022-11-07 LAB — TSH: TSH: 0.794 u[IU]/mL (ref 0.450–4.500)

## 2022-11-07 NOTE — Progress Notes (Signed)
Please inform the patient that her thyroid levels are within normal limits

## 2022-11-07 NOTE — Telephone Encounter (Signed)
Patient return call. ?

## 2022-11-07 NOTE — Telephone Encounter (Signed)
Spoke with patient.

## 2022-12-24 ENCOUNTER — Ambulatory Visit (INDEPENDENT_AMBULATORY_CARE_PROVIDER_SITE_OTHER): Payer: Medicare HMO | Admitting: Family Medicine

## 2022-12-24 ENCOUNTER — Encounter: Payer: Self-pay | Admitting: Family Medicine

## 2022-12-24 VITALS — BP 131/85 | HR 88 | Ht 64.5 in | Wt 157.1 lb

## 2022-12-24 DIAGNOSIS — E7849 Other hyperlipidemia: Secondary | ICD-10-CM

## 2022-12-24 DIAGNOSIS — R7301 Impaired fasting glucose: Secondary | ICD-10-CM

## 2022-12-24 DIAGNOSIS — E559 Vitamin D deficiency, unspecified: Secondary | ICD-10-CM | POA: Diagnosis not present

## 2022-12-24 DIAGNOSIS — B349 Viral infection, unspecified: Secondary | ICD-10-CM

## 2022-12-24 DIAGNOSIS — E038 Other specified hypothyroidism: Secondary | ICD-10-CM

## 2022-12-24 DIAGNOSIS — N950 Postmenopausal bleeding: Secondary | ICD-10-CM | POA: Diagnosis not present

## 2022-12-24 NOTE — Assessment & Plan Note (Signed)
She take rosuvastatin 20 mg daily She denies muscle aches, constipation, and headaches Will assess lipid panel today Lab Results  Component Value Date   CHOL 258 (H) 09/22/2022   HDL 70 09/22/2022   LDLCALC 175 (H) 09/22/2022   TRIG 79 09/22/2022   CHOLHDL 3.7 09/22/2022

## 2022-12-24 NOTE — Assessment & Plan Note (Signed)
Reports cough with phlegm production since 12/19/2022 She notes feeling well today with minimal cough She has been treating her symptoms conservatively and symptomatically Encouraged to continue symptomatic treatment over-the-counter Increase fluids and allow for plenty of rest. Recommend Tylenol or ibuprofen as needed for pain, fever, or general discomfort. Warm salt water gargles 3-4 times daily to help with throat pain or discomfort. Recommend using a humidifier at bedtime during sleep to help with cough and nasal congestion. Follow-up if your symptoms do not improve

## 2022-12-24 NOTE — Assessment & Plan Note (Signed)
Reports a slight pinkish discoloration after wiping from urination yesterday No other symptoms reported Her biopsy was negative for endometrial malignancy in 08/2022 Discoloration likely due to atrophic vaginitis Will continue to monitor

## 2022-12-24 NOTE — Patient Instructions (Addendum)
I appreciate the opportunity to provide care to you today!    Follow up:  4 months  Labs: please stop by the lab today to get your blood drawn (CBC, CMP, TSH, Lipid profile, HgA1c, Vit D)  I recommend taking OTC Mucinex cough and cold congestion for relief symptoms of chest congestion. Increase fluids and allow for plenty of rest. Recommend using a humidifier at bedtime during sleep to help with cough and nasal congestion. Follow-up if your symptoms do not improve      Please continue to a heart-healthy diet and increase your physical activities. Try to exercise for 29mins at least five times a week.      It was a pleasure to see you and I look forward to continuing to work together on your health and well-being. Please do not hesitate to call the office if you need care or have questions about your care.   Have a wonderful day and week. With Gratitude, Alvira Monday MSN, FNP-BC

## 2022-12-24 NOTE — Progress Notes (Signed)
Established Patient Office Visit  Subjective:  Patient ID: Carolyn Raymond, female    DOB: Dec 13, 1955  Age: 67 y.o. MRN: [id number]  CC:  Chief Complaint  Patient presents with   Follow-up    3 month f/u, noticed slight pinkish after wiping from urinating yesterday morning, 02/06./   Cough    Pt reports phlegm and cough ongoing since 12/19/2022.     HPI  Carolyn Raymond is a 67 y.o. female with past medical history of GERD, allergic rhinitis, and postmenstrual bleeding presents for f/u of  chronic medical conditions.  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    History reviewed. No pertinent family history.  Social History   Socioeconomic History   Marital status: Married    Spouse name: Not on file   Number of children: Not on file   Years of education: Not on file   Highest education level: Not on file  Occupational History   Not on file  Tobacco Use   Smoking status: Never   Smokeless tobacco: Never  Vaping Use   Vaping Use: Never used  Substance and Sexual Activity   Alcohol use: Yes    Comment: occasionally   Drug use: No   Sexual activity: Yes    Birth control/protection: Post-menopausal  Other Topics Concern   Not on file  Social History Narrative   Not on file   Social Determinants of Health   Financial  Resource Strain: Low Risk  (08/26/2022)   Overall Financial Resource Strain (CARDIA)    Difficulty of Paying Living Expenses: Not hard at all  Food Insecurity: No Food Insecurity (08/26/2022)   Hunger Vital Sign    Worried About Running Out of Food in the Last Year: Never true    [place] in the Last Year: Never true  Transportation Needs: No Transportation Needs (08/26/2022)   PRAPARE - Hydrologist (Medical): No    Lack of Transportation (Non-Medical): No  Physical Activity: Insufficiently Active (08/26/2022)   Exercise Vital Sign    Days of Exercise per Week: 1 day    Minutes of Exercise per Session: 30 min  Stress: No Stress Concern Present (08/26/2022)   [place]    Feeling of Stress : Only a little  Social Connections: Socially Integrated (08/26/2022)   Social Connection and Isolation Panel [NHANES]    Frequency of Communication with Friends and Family: More than three times a week    Frequency of Social Gatherings with Friends and Family: Twice a week    Attends Religious Services: More than 4 times per year    Active Member of [employer] or Organizations: Yes    Attends Archivist Meetings: 1 to 4 times per  year    Marital Status: Married  Human resources officer Violence: Not At Risk (08/26/2022)   Humiliation, Afraid, Rape, and Kick questionnaire    Fear of Current or Ex-Partner: No    Emotionally Abused: No    Physically Abused: No    Sexually Abused: No    Outpatient Medications Prior to Visit  Medication Sig Dispense Refill   Ascorbic Acid (VITAMIN C PO) Take by mouth daily.     aspirin 325 MG tablet Take 325 mg by mouth daily as needed for headache.     CALCIUM PO Take 1,200 mg by mouth daily.     cetirizine (ZYRTEC ALLERGY) 10 MG tablet Take 1 tablet (10 mg total) by mouth daily. 30 tablet 0   Cholecalciferol (VITAMIN D-3) 25 MCG (1000 UT) CAPS Take 1,000 Units  by mouth daily.     diazepam (VALIUM) 5 MG tablet Take one pill about 30 minutes prior to appointment 1 tablet 0   ezetimibe (ZETIA) 10 MG tablet Take 1 tablet (10 mg total) by mouth daily. 90 tablet 3   ferrous sulfate 324 (65 Fe) MG TBEC Take by mouth.     Garlic 0623 MG CAPS Take 1,000 mg by mouth daily.     hydrocortisone cream 0.5 % Apply 1 Application topically 2 (two) times daily. 30 g 0   hydrOXYzine (VISTARIL) 25 MG capsule Take 1 capsule (25 mg total) by mouth every 8 (eight) hours as needed. 30 capsule 0   ondansetron (ZOFRAN-ODT) 4 MG disintegrating tablet Take 1 tablet (4 mg total) by mouth every 8 (eight) hours as needed for nausea or vomiting. 20 tablet 0   rosuvastatin (CRESTOR) 20 MG tablet Take 1 tablet (20 mg total) by mouth daily. (Patient taking differently: Take 20 mg by mouth 2 (two) times a week.) 90 tablet 3   VITAMIN A PO Take 2,400 mg by mouth 4 (four) times a week.     No facility-administered medications prior to visit.    Allergies  Allergen Reactions   Bee Pollen Swelling    Can always tell when they are around   Isovue [Iopamidol] Itching and Swelling    ROS Review of Systems  Constitutional:  Negative for chills and fever.  Eyes:  Negative for visual disturbance.  Respiratory:  Negative for chest tightness and shortness of breath.   Neurological:  Negative for dizziness and headaches.      Objective:    Physical Exam HENT:     Head: Normocephalic.     Mouth/Throat:     Mouth: Mucous membranes are moist.  Cardiovascular:     Rate and Rhythm: Normal rate.     Heart sounds: Normal heart sounds.  Pulmonary:     Effort: Pulmonary effort is normal.     Breath sounds: Normal breath sounds.  Neurological:     Mental Status: She is alert.     BP 131/85   Pulse 88   Ht 5' 4.5" (1.638 m)   Wt 157 lb 1.9 oz (71.3 kg)   SpO2 97%   BMI 26.55 kg/m  Wt Readings from Last 3 Encounters:  12/24/22 157 lb 1.9 oz (71.3 kg)  09/22/22 162 lb 1.3 oz  (73.5 kg)  09/15/22 162 lb 6.4 oz (73.7 kg)    Lab Results  Component Value Date   TSH 0.794 11/06/2022   Lab Results  Component Value Date   WBC 6.7 09/22/2022   HGB 14.1 09/22/2022   HCT 42.4 09/22/2022   MCV 90  09/22/2022   PLT 274 09/22/2022   Lab Results  Component Value Date   NA 139 09/22/2022   K 4.5 09/22/2022   CO2 25 09/22/2022   GLUCOSE 92 09/22/2022   BUN 13 09/22/2022   CREATININE 0.91 09/22/2022   BILITOT 0.5 09/22/2022   ALKPHOS 143 (H) 09/22/2022   AST 15 09/22/2022   ALT 10 09/22/2022   PROT 7.6 09/22/2022   ALBUMIN 4.1 09/22/2022   CALCIUM 9.4 09/22/2022   ANIONGAP 8 03/12/2022   EGFR 70 09/22/2022   Lab Results  Component Value Date   CHOL 258 (H) 09/22/2022   Lab Results  Component Value Date   HDL 70 09/22/2022   Lab Results  Component Value Date   LDLCALC 175 (H) 09/22/2022   Lab Results  Component Value Date   TRIG 79 09/22/2022   Lab Results  Component Value Date   CHOLHDL 3.7 09/22/2022   Lab Results  Component Value Date   HGBA1C 5.8 (H) 09/22/2022      Assessment & Plan:  Viral illness Assessment & Plan: Reports cough with phlegm production since 12/19/2022 She notes feeling well today with minimal cough She has been treating her symptoms conservatively and symptomatically Encouraged to continue symptomatic treatment over-the-counter Increase fluids and allow for plenty of rest. Recommend Tylenol or ibuprofen as needed for pain, fever, or general discomfort. Warm salt water gargles 3-4 times daily to help with throat pain or discomfort. Recommend using a humidifier at bedtime during sleep to help with cough and nasal congestion. Follow-up if your symptoms do not improve     PMB (postmenopausal bleeding) Assessment & Plan: Reports a slight pinkish discoloration after wiping from urination yesterday No other symptoms reported Her biopsy was negative for endometrial malignancy in 08/2022 Discoloration likely due to  atrophic vaginitis Will continue to monitor   Other hyperlipidemia Assessment & Plan: She take rosuvastatin 20 mg daily She denies muscle aches, constipation, and headaches Will assess lipid panel today Lab Results  Component Value Date   CHOL 258 (H) 09/22/2022   HDL 70 09/22/2022   LDLCALC 175 (H) 09/22/2022   TRIG 79 09/22/2022   CHOLHDL 3.7 09/22/2022     Orders: -     Lipid panel -     CMP14+EGFR -     CBC with Differential/Platelet  IFG (impaired fasting glucose) -     Hemoglobin A1c  Other specified hypothyroidism -     TSH + free T4  Vitamin D deficiency -     VITAMIN D 25 Hydroxy (Vit-D Deficiency, Fractures)    Follow-up: Return in about 4 months (around 04/24/2023).   Alvira Monday, FNP

## 2022-12-26 LAB — CMP14+EGFR
ALT: 27 IU/L (ref 0–32)
AST: 34 IU/L (ref 0–40)
Albumin/Globulin Ratio: 1.2 (ref 1.2–2.2)
Albumin: 4.2 g/dL (ref 3.9–4.9)
Alkaline Phosphatase: 114 IU/L (ref 44–121)
BUN/Creatinine Ratio: 19 (ref 12–28)
BUN: 15 mg/dL (ref 8–27)
Bilirubin Total: 0.3 mg/dL (ref 0.0–1.2)
CO2: 19 mmol/L — ABNORMAL LOW (ref 20–29)
Calcium: 9.2 mg/dL (ref 8.7–10.3)
Chloride: 100 mmol/L (ref 96–106)
Creatinine, Ser: 0.81 mg/dL (ref 0.57–1.00)
Globulin, Total: 3.6 g/dL (ref 1.5–4.5)
Glucose: 84 mg/dL (ref 70–99)
Potassium: 4 mmol/L (ref 3.5–5.2)
Sodium: 142 mmol/L (ref 134–144)
Total Protein: 7.8 g/dL (ref 6.0–8.5)
eGFR: 80 mL/min/{1.73_m2} (ref >59)

## 2022-12-26 LAB — HEMOGLOBIN A1C
Est. average glucose Bld gHb Est-mCnc: 126 mg/dL
Hgb A1c MFr Bld: 6 % — ABNORMAL HIGH (ref 4.8–5.6)

## 2022-12-26 LAB — TSH+FREE T4
Free T4: 1.14 ng/dL (ref 0.82–1.77)
TSH: 0.678 u[IU]/mL (ref 0.450–4.500)

## 2022-12-26 LAB — CBC WITH DIFFERENTIAL/PLATELET
Basophils Absolute: 0 10*3/uL (ref 0.0–0.2)
Basos: 1 %
EOS (ABSOLUTE): 0 10*3/uL (ref 0.0–0.4)
Eos: 1 %
Hematocrit: 43.8 % (ref 34.0–46.6)
Hemoglobin: 14.6 g/dL (ref 11.1–15.9)
Immature Grans (Abs): 0 10*3/uL (ref 0.0–0.1)
Immature Granulocytes: 0 %
Lymphocytes Absolute: 1.8 10*3/uL (ref 0.7–3.1)
Lymphs: 42 %
MCH: 30.1 pg (ref 26.6–33.0)
MCHC: 33.3 g/dL (ref 31.5–35.7)
MCV: 90 fL (ref 79–97)
Monocytes Absolute: 0.3 10*3/uL (ref 0.1–0.9)
Monocytes: 7 %
Neutrophils Absolute: 2.1 10*3/uL (ref 1.4–7.0)
Neutrophils: 49 %
Platelets: 180 10*3/uL (ref 150–450)
RBC: 4.85 x10E6/uL (ref 3.77–5.28)
RDW: 13 % (ref 11.7–15.4)
WBC: 4.3 10*3/uL (ref 3.4–10.8)

## 2022-12-26 LAB — LIPID PANEL
Chol/HDL Ratio: 3.4 ratio (ref 0.0–4.4)
Cholesterol, Total: 171 mg/dL (ref 100–199)
HDL: 50 mg/dL (ref >39)
LDL Chol Calc (NIH): 104 mg/dL — ABNORMAL HIGH (ref 0–99)
Triglycerides: 92 mg/dL (ref 0–149)
VLDL Cholesterol Cal: 17 mg/dL (ref 5–40)

## 2022-12-29 NOTE — Progress Notes (Signed)
Please inform the patient that her cholesterol levels have improved.  Her LDL has decreased from 175 to 104.  I want her LDL to be less than 100.  Her hemoglobin A1c has increased from 5.8 to 6.0.  I recommend avoiding simple carbohydrates, including cakes, sweet desserts, ice cream, soda (diet or regular), sweet tea, candies, chips, cookies, store-bought juices, alcohol in excess of 1-2 drinks a day, lemonade, artificial sweeteners, donuts, coffee creamers, and sugar-free products.  I recommend avoiding greasy, fatty foods with increased physical activity.  Please encourage her to continue on treatment regimen.  All other labs are stable.

## 2023-02-09 ENCOUNTER — Ambulatory Visit (INDEPENDENT_AMBULATORY_CARE_PROVIDER_SITE_OTHER): Payer: Medicare HMO | Admitting: Family Medicine

## 2023-02-09 ENCOUNTER — Encounter: Payer: Self-pay | Admitting: Family Medicine

## 2023-02-09 VITALS — BP 136/74 | HR 80 | Ht 64.5 in | Wt 160.1 lb

## 2023-02-09 DIAGNOSIS — N950 Postmenopausal bleeding: Secondary | ICD-10-CM

## 2023-02-09 DIAGNOSIS — E7849 Other hyperlipidemia: Secondary | ICD-10-CM | POA: Diagnosis not present

## 2023-02-09 MED ORDER — MEGESTROL ACETATE 20 MG PO TABS: 20.0000 mg | ORAL_TABLET | Freq: Two times a day (BID) | ORAL | 0 refills | Status: AC

## 2023-02-09 NOTE — Assessment & Plan Note (Signed)
Reports having abnormal uterine bleeding since Saturday, 02/07/2023 She reports passing a marble sized clot with her abnormal bleeding Bleeding with heavy on 02/07/2023 but has lightened up She had an endometrial biopsy in 08/2022 which was negative Will treat abnormal uterine bleeding today with Megace 20 mg twice daily for 3 days Encouraged the patient to follow-up with [facility]

## 2023-02-09 NOTE — Patient Instructions (Signed)
I appreciate the opportunity to provide care to you today!    Follow up:  3 months  Labs:  next visit  Please pick up your medication at the pharmacy  and f/u with [facility]     Please continue to a heart-healthy diet and increase your physical activities. Try to exercise for 21mins at least five days a week.      It was a pleasure to see you and I look forward to continuing to work together on your health and well-being. Please do not hesitate to call the office if you need care or have questions about your care.   Have a wonderful day and week. With Gratitude, Alvira Monday MSN, FNP-BC

## 2023-02-09 NOTE — Assessment & Plan Note (Signed)
She take rosuvastatin 20 mg daily and is ezetimibe 10 mg daily She denies muscle aches, constipation, and headaches Will assess lipid panel today Lab Results  Component Value Date   CHOL 171 12/24/2022   HDL 50 12/24/2022   LDLCALC 104 (H) 12/24/2022   TRIG 92 12/24/2022   CHOLHDL 3.4 12/24/2022

## 2023-02-09 NOTE — Progress Notes (Signed)
Established Patient Office Visit  Subjective:  Patient ID: Carolyn Raymond, female    DOB: 05-14-1956  Age: 67 y.o. MRN: [id number]  CC:  Chief Complaint  Patient presents with   Menopause    Pt reports abnormal bleeding, heavy bleeding over the weekend. Went to [facility] however provider stated she needed to f/u with pcp.     HPI Carolyn Raymond is a 67 y.o. female with past medical history of postmenopausal bleeding, and hyperlipidemia presents for f/u of  chronic medical conditions. For the details of today's visit, please refer to the assessment and plan.     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    History reviewed. No pertinent family history.  Social History   Socioeconomic History   Marital status: Married    Spouse name: Not on file   Number of children: Not on file   Years of education: Not on file   Highest education level: Not on file  Occupational History   Not on file  Tobacco Use   Smoking status: Never   Smokeless tobacco: Never  Vaping Use   Vaping Use: Never used  Substance and Sexual Activity   Alcohol use: Yes    Comment: occasionally   Drug use: No   Sexual activity: Yes    Birth control/protection: Post-menopausal  Other Topics Concern   Not on file  Social History Narrative   Not on file   Social  Determinants of Health   Financial Resource Strain: Low Risk  (08/26/2022)   Overall Financial Resource Strain (CARDIA)    Difficulty of Paying Living Expenses: Not hard at all  Food Insecurity: No Food Insecurity (08/26/2022)   Hunger Vital Sign    Worried About Running Out of Food in the Last Year: Never true    [place] in the Last Year: Never true  Transportation Needs: No Transportation Needs (08/26/2022)   PRAPARE - Hydrologist (Medical): No    Lack of Transportation (Non-Medical): No  Physical Activity: Insufficiently Active (08/26/2022)   Exercise Vital Sign    Days of Exercise per Week: 1 day    Minutes of Exercise per Session: 30 min  Stress: No Stress Concern Present (08/26/2022)   [place]    Feeling of Stress : Only a little  Social Connections: Socially Integrated (08/26/2022)   Social Connection and Isolation Panel [NHANES]    Frequency of Communication with Friends and Family: More than three times a week    Frequency of Social Gatherings with Friends and Family: Twice a week    Attends Religious Services: More than 4 times per year    Active Member of [employer] or Organizations: Yes    Attends Club or  Organization Meetings: 1 to 4 times per year    Marital Status: Married  Human resources officer Violence: Not At Risk (08/26/2022)   Humiliation, Afraid, Rape, and Kick questionnaire    Fear of Current or Ex-Partner: No    Emotionally Abused: No    Physically Abused: No    Sexually Abused: No    Outpatient Medications Prior to Visit  Medication Sig Dispense Refill   Ascorbic Acid (VITAMIN C PO) Take by mouth daily.     aspirin 325 MG tablet Take 325 mg by mouth daily as needed for headache.     CALCIUM PO Take 1,200 mg by mouth daily.     cetirizine (ZYRTEC ALLERGY) 10 MG tablet Take 1 tablet (10 mg total) by mouth daily. 30 tablet 0   Cholecalciferol (VITAMIN D-3) 25  MCG (1000 UT) CAPS Take 1,000 Units by mouth daily.     diazepam (VALIUM) 5 MG tablet Take one pill about 30 minutes prior to appointment 1 tablet 0   ezetimibe (ZETIA) 10 MG tablet Take 1 tablet (10 mg total) by mouth daily. 90 tablet 3   ferrous sulfate 324 (65 Fe) MG TBEC Take by mouth.     Garlic 123XX123 MG CAPS Take 1,000 mg by mouth daily.     hydrocortisone cream 0.5 % Apply 1 Application topically 2 (two) times daily. 30 g 0   hydrOXYzine (VISTARIL) 25 MG capsule Take 1 capsule (25 mg total) by mouth every 8 (eight) hours as needed. 30 capsule 0   ondansetron (ZOFRAN-ODT) 4 MG disintegrating tablet Take 1 tablet (4 mg total) by mouth every 8 (eight) hours as needed for nausea or vomiting. 20 tablet 0   rosuvastatin (CRESTOR) 20 MG tablet Take 1 tablet (20 mg total) by mouth daily. (Patient taking differently: Take 20 mg by mouth 2 (two) times a week.) 90 tablet 3   VITAMIN A PO Take 2,400 mg by mouth 4 (four) times a week.     No facility-administered medications prior to visit.    Allergies  Allergen Reactions   Bee Pollen Swelling    Can always tell when they are around   Isovue [Iopamidol] Itching and Swelling    ROS Review of Systems  Constitutional:  Negative for chills and fever.  Eyes:  Negative for visual disturbance.  Respiratory:  Negative for chest tightness and shortness of breath.   Genitourinary:  Positive for menstrual problem.  Neurological:  Negative for dizziness and headaches.      Objective:    Physical Exam HENT:     Head: Normocephalic.     Mouth/Throat:     Mouth: Mucous membranes are moist.  Cardiovascular:     Rate and Rhythm: Normal rate.     Heart sounds: Normal heart sounds.  Pulmonary:     Effort: Pulmonary effort is normal.     Breath sounds: Normal breath sounds.  Neurological:     Mental Status: She is alert.     BP 136/74   Pulse 80   Ht 5' 4.5" (1.638 m)   Wt 160 lb 1.9 oz (72.6 kg)   SpO2 96%   BMI 27.06 kg/m  Wt Readings  from Last 3 Encounters:  02/09/23 160 lb 1.9 oz (72.6 kg)  12/24/22 157 lb 1.9 oz (71.3 kg)  09/22/22 162 lb 1.3 oz (73.5 kg)    Lab Results  Component Value Date   TSH 0.678 12/24/2022   Lab Results  Component Value Date   WBC 4.3 12/24/2022  HGB 14.6 12/24/2022   HCT 43.8 12/24/2022   MCV 90 12/24/2022   PLT 180 12/24/2022   Lab Results  Component Value Date   NA 142 12/24/2022   K 4.0 12/24/2022   CO2 19 (L) 12/24/2022   GLUCOSE 84 12/24/2022   BUN 15 12/24/2022   CREATININE 0.81 12/24/2022   BILITOT 0.3 12/24/2022   ALKPHOS 114 12/24/2022   AST 34 12/24/2022   ALT 27 12/24/2022   PROT 7.8 12/24/2022   ALBUMIN 4.2 12/24/2022   CALCIUM 9.2 12/24/2022   ANIONGAP 8 03/12/2022   EGFR 80 12/24/2022   Lab Results  Component Value Date   CHOL 171 12/24/2022   Lab Results  Component Value Date   HDL 50 12/24/2022   Lab Results  Component Value Date   LDLCALC 104 (H) 12/24/2022   Lab Results  Component Value Date   TRIG 92 12/24/2022   Lab Results  Component Value Date   CHOLHDL 3.4 12/24/2022   Lab Results  Component Value Date   HGBA1C 6.0 (H) 12/24/2022      Assessment & Plan:  PMB (postmenopausal bleeding) Assessment & Plan: Reports having abnormal uterine bleeding since Saturday, 02/07/2023 She reports passing a marble sized clot with her abnormal bleeding Bleeding with heavy on 02/07/2023 but has lightened up She had an endometrial biopsy in 08/2022 which was negative Will treat abnormal uterine bleeding today with Megace 20 mg twice daily for 3 days Encouraged the patient to [facility]   Orders: -     Megestrol Acetate; Take 1 tablet (20 mg total) by mouth 2 (two) times daily for 3 days.  Dispense: 6 tablet; Refill: 0  Other hyperlipidemia Assessment & Plan: She take rosuvastatin 20 mg daily and is ezetimibe 10 mg daily She denies muscle aches, constipation, and headaches Will assess lipid panel today Lab Results   Component Value Date   CHOL 171 12/24/2022   HDL 50 12/24/2022   LDLCALC 104 (H) 12/24/2022   TRIG 92 12/24/2022   CHOLHDL 3.4 12/24/2022        Follow-up: Return in about 3 months (around 05/12/2023).   Alvira Monday, FNP

## 2023-02-16 ENCOUNTER — Telehealth: Payer: Self-pay | Admitting: Family Medicine

## 2023-02-16 NOTE — Telephone Encounter (Signed)
Patient was seen last week and needs medicine now has develop dirrahea.  Pharmacy: [facility]

## 2023-02-18 NOTE — Telephone Encounter (Signed)
Patient no longer has symptoms.

## 2023-02-18 NOTE — Telephone Encounter (Signed)
Kindly ask the patient to schedule a tele visit  for further examination of the patient's symptoms.

## 2023-03-06 ENCOUNTER — Ambulatory Visit: Payer: Medicare HMO | Admitting: Obstetrics & Gynecology

## 2023-03-06 ENCOUNTER — Encounter: Payer: Self-pay | Admitting: Obstetrics & Gynecology

## 2023-03-06 VITALS — BP 120/86 | HR 91 | Ht 61.2 in | Wt 158.0 lb

## 2023-03-06 DIAGNOSIS — N950 Postmenopausal bleeding: Secondary | ICD-10-CM | POA: Diagnosis not present

## 2023-03-06 MED ORDER — NORETHINDRONE ACETATE 5 MG PO TABS
5.0000 mg | ORAL_TABLET | Freq: Every day | ORAL | 4 refills | Status: DC
Start: 2023-03-06 — End: 2023-06-01

## 2023-03-06 NOTE — Progress Notes (Signed)
GYN VISIT Patient name: Carolyn Raymond MRN [id number]  Date of birth: 05-27-1956 Chief Complaint:   Vaginal Bleeding (Seem by family doctor given megace stop bleeding,start back bleeding on 02-23-23)  History of Present Illness:   Carolyn Raymond is a 67 y.o. [phone] PM female being seen today for the following concerns.     Postmenopausal bleeding: Started again in March- when she uses the bathroom- notices small spots of blood then seen by PCP.  She was started on Megace x 3 days and the bleeding stopped completely.  Then again this past month, last week it became more of a "period" blood and lasted for about a week then started to turn brown on Wednesday and now has resolved.  Denies pelvic or abdominal pain.  Patient was previously seen for postmenopausal bleeding in October 2023.  EMB at that time was negative with proliferative endometrium     02/09/2023    2:56 PM 12/24/2022   10:22 AM 09/22/2022   10:03 AM 08/26/2022    1:34 PM 08/11/2022    2:31 PM  Depression screen PHQ 2/9  Decreased Interest 0 0 0 0 0  Down, Depressed, Hopeless 0 0 0 0 0  PHQ - 2 Score 0 0 0 0 0  Altered sleeping 1 0  0   Tired, decreased energy 0 0  0   Change in appetite 0 0  0   Feeling bad or failure about yourself  0 0  0   Trouble concentrating 0 0  0   Moving slowly or fidgety/restless 0 0  0   Suicidal thoughts 0 0  0   PHQ-9 Score 1 0  0   Difficult doing work/chores Not difficult at all         Review of Systems:   Pertinent items are noted in HPI Denies fever/chills, dizziness, headaches, visual disturbances, fatigue, shortness of breath, chest pain, abdominal pain, vomiting, no problems with bowel movements, urination, or intercourse unless otherwise stated above.  Pertinent History Reviewed:  Reviewed past medical,surgical, social, obstetrical and family history.  Reviewed problem list, medications and allergies. Physical Assessment:   Vitals:   03/06/23 1009  BP: 120/86   Pulse: 91  Weight: 158 lb (71.7 kg)  Height: 5' 1.2" (1.554 m)  Body mass index is 29.66 kg/m.       Physical Examination:   General appearance: alert, well appearing, and in no distress  Psych: mood appropriate, normal affect  Skin: warm & dry   Cardiovascular: normal heart rate noted  Respiratory: normal respiratory effort, no distress  Abdomen: soft, non-tender   Pelvic: VULVA: normal appearing vulva with no masses, tenderness or lesions, VAGINA: normal appearing vagina with normal color, dark brown discharge consistent with blood noted, no lesions, CERVIX: normal appearing cervix without discharge or lesions.  On bimanual exam normal size uterus no masses appreciated, no adnexal tenderness  Extremities: no edema   Chaperone: Faith Rogue    Assessment & Plan:  1) postmenopausal bleeding -EMB completed in October 2023, since it has been less than a year since prior biopsy we will postpone at this time -EMB showed proliferative endometrium, will plan to start progesterone daily -Plan for follow-up in [redacted] weeks along with ultrasound to evaluate the endometrial lining s/p progesterone therapy -Questions and concerns were addressed, patient agreeable to above  Meds ordered this encounter  Medications   norethindrone (AYGESTIN) 5 MG tablet    Sig: Take 1 tablet (5 mg total) by  mouth daily.    Dispense:  90 tablet    Refill:  4     Orders Placed This Encounter  Procedures   US PELVIC COMPLETE WITH TRANSVAGINAL    Return for please schedule pelvic US 8wks from now our office and follow up with me, Medication follow up.   Myna Hidalgo, DO Attending Obstetrician & Gynecologist, [facility] for [employer], [facility]

## 2023-03-30 ENCOUNTER — Telehealth: Payer: Self-pay

## 2023-03-30 ENCOUNTER — Encounter: Payer: Self-pay | Admitting: Obstetrics & Gynecology

## 2023-03-30 NOTE — Telephone Encounter (Signed)
Called patient to reschedule patient's appointment on 6/20 due to being closed that afternoon for md/app meeting. Voice mail is not set up.

## 2023-04-29 ENCOUNTER — Encounter: Payer: Self-pay | Admitting: Family Medicine

## 2023-04-29 ENCOUNTER — Ambulatory Visit (INDEPENDENT_AMBULATORY_CARE_PROVIDER_SITE_OTHER): Payer: Medicare HMO | Admitting: Family Medicine

## 2023-04-29 VITALS — BP 128/82 | HR 87 | Ht 64.5 in | Wt 159.1 lb

## 2023-04-29 DIAGNOSIS — E7849 Other hyperlipidemia: Secondary | ICD-10-CM

## 2023-04-29 DIAGNOSIS — R7301 Impaired fasting glucose: Secondary | ICD-10-CM

## 2023-04-29 DIAGNOSIS — E559 Vitamin D deficiency, unspecified: Secondary | ICD-10-CM | POA: Diagnosis not present

## 2023-04-29 DIAGNOSIS — E038 Other specified hypothyroidism: Secondary | ICD-10-CM | POA: Diagnosis not present

## 2023-04-29 DIAGNOSIS — R319 Hematuria, unspecified: Secondary | ICD-10-CM | POA: Diagnosis not present

## 2023-04-29 NOTE — Assessment & Plan Note (Signed)
She take rosuvastatin 20 mg daily and is ezetimibe 10 mg daily She denies muscle aches, constipation, and headaches Will assess lipid panel today Lab Results  Component Value Date   CHOL 171 12/24/2022   HDL 50 12/24/2022   LDLCALC 104 (H) 12/24/2022   TRIG 92 12/24/2022   CHOLHDL 3.4 12/24/2022    

## 2023-04-29 NOTE — Patient Instructions (Addendum)
I appreciate the opportunity to provide care to you today!    Follow up:  3 months  Labs: please stop by the lab today/during the week to get your blood drawn (CBC, CMP, TSH, Lipid profile, HgA1c, Vit D)    Please continue to a heart-healthy diet and increase your physical activities. Try to exercise for at least five days a week.      It was a pleasure to see you and I look forward to continuing to work together on your health and well-being. Please do not hesitate to call the office if you need care or have questions about your care.   Have a wonderful day and week. With Gratitude, Gilmore Laroche MSN, FNP-BC

## 2023-04-29 NOTE — Assessment & Plan Note (Signed)
No recent episode reported She reports starting hormone replacement therapy 3 months ago by her gynecologist No complaints or concerns voiced

## 2023-04-29 NOTE — Progress Notes (Signed)
Established Patient Office Visit  Subjective:  Patient ID: Carolyn Raymond, female    DOB: 05-26-1956  Age: 67 y.o. MRN: [id number]  CC:  Chief Complaint  Patient presents with   Chronic Care Management    4 month f/u,     HPI Carolyn Raymond is a 67 y.o. female with past medical history of hyperlipidemia and painless hematuria presents for f/u of  chronic medical conditions. For the details of today's visit, please refer to the assessment and plan.     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    Family History  Problem Relation Age of Onset   Pancreatic cancer Father     Social History   Socioeconomic History   Marital status: Married    Spouse name: Not on file   Number of children: Not on file   Years of education: Not on file   Highest education level: Not on file  Occupational History   Not on file  Tobacco Use   Smoking status: Never   Smokeless tobacco: Never  Vaping Use   Vaping Use: Never used  Substance and Sexual Activity   Alcohol use: Yes    Comment: occasionally   Drug use: No   Sexual activity: Yes    Birth control/protection: Post-menopausal  Other Topics Concern   Not on file  Social History Narrative   Not on file   Social Determinants of Health   Financial Resource Strain: Low Risk  (08/26/2022)    Overall Financial Resource Strain (CARDIA)    Difficulty of Paying Living Expenses: Not hard at all  Food Insecurity: No Food Insecurity (08/26/2022)   Hunger Vital Sign    Worried About Running Out of Food in the Last Year: Never true    Ran Out of Food in the Last Year: Never true  Transportation Needs: No Transportation Needs (08/26/2022)   PRAPARE - Administrator, Civil Service (Medical): No    Lack of Transportation (Non-Medical): No  Physical Activity: Insufficiently Active (08/26/2022)   Exercise Vital Sign    Days of Exercise per Week: 1 day    Minutes of Exercise per Session: 30 min  Stress: No Stress Concern Present (08/26/2022)   [employer] of Occupational Health - Occupational Stress Questionnaire    Feeling of Stress : Only a little  Social Connections: Socially Integrated (08/26/2022)   Social Connection and Isolation Panel [NHANES]    Frequency of Communication with Friends and Family: More than three times a week    Frequency of Social Gatherings with Friends and Family: Twice a week    Attends Religious Services: More than 4 times per year    Active Member of [employer] or Organizations: Yes    Attends Banker Meetings: 1 to 4 times  per year    Marital Status: Married  Catering manager Violence: Not At Risk (08/26/2022)   Humiliation, Afraid, Rape, and Kick questionnaire    Fear of Current or Ex-Partner: No    Emotionally Abused: No    Physically Abused: No    Sexually Abused: No    Outpatient Medications Prior to Visit  Medication Sig Dispense Refill   Ascorbic Acid (VITAMIN C PO) Take by mouth daily.     aspirin 325 MG tablet Take 325 mg by mouth daily as needed for headache.     CALCIUM PO Take 1,200 mg by mouth daily.     cetirizine (ZYRTEC ALLERGY) 10 MG tablet Take 1 tablet (10 mg total) by mouth daily. 30 tablet 0   Cholecalciferol (VITAMIN D-3) 25 MCG (1000 UT) CAPS Take 1,000 Units by mouth daily.     diazepam (VALIUM) 5 MG  tablet Take one pill about 30 minutes prior to appointment 1 tablet 0   ezetimibe (ZETIA) 10 MG tablet Take 1 tablet (10 mg total) by mouth daily. 90 tablet 3   ferrous sulfate 324 (65 Fe) MG TBEC Take by mouth.     Garlic 1000 MG CAPS Take 1,000 mg by mouth daily.     hydrocortisone cream 0.5 % Apply 1 Application topically 2 (two) times daily. 30 g 0   hydrOXYzine (VISTARIL) 25 MG capsule Take 1 capsule (25 mg total) by mouth every 8 (eight) hours as needed. 30 capsule 0   norethindrone (AYGESTIN) 5 MG tablet Take 1 tablet (5 mg total) by mouth daily. 90 tablet 4   ondansetron (ZOFRAN-ODT) 4 MG disintegrating tablet Take 1 tablet (4 mg total) by mouth every 8 (eight) hours as needed for nausea or vomiting. 20 tablet 0   rosuvastatin (CRESTOR) 20 MG tablet Take 1 tablet (20 mg total) by mouth daily. (Patient taking differently: Take 20 mg by mouth 2 (two) times a week.) 90 tablet 3   VITAMIN A PO Take 2,400 mg by mouth 4 (four) times a week.     No facility-administered medications prior to visit.    Allergies  Allergen Reactions   Bee Pollen Swelling    Can always tell when they are around   Isovue [Iopamidol] Itching and Swelling    ROS Review of Systems  Constitutional:  Negative for chills and fever.  Eyes:  Negative for visual disturbance.  Respiratory:  Negative for chest tightness and shortness of breath.   Neurological:  Negative for dizziness and headaches.      Objective:    Physical Exam HENT:     Head: Normocephalic.     Mouth/Throat:     Mouth: Mucous membranes are moist.  Cardiovascular:     Rate and Rhythm: Normal rate.     Heart sounds: Normal heart sounds.  Pulmonary:     Effort: Pulmonary effort is normal.     Breath sounds: Normal breath sounds.  Neurological:     Mental Status: She is alert.     BP 128/82 (BP Location: Left Arm)   Pulse 87   Ht 5' 4.5" (1.638 m)   Wt 159 lb 1.9 oz (72.2 kg)   SpO2 97%   BMI 26.89 kg/m  Wt Readings from Last  3 Encounters:  04/29/23 159 lb 1.9 oz (72.2 kg)  03/06/23 158 lb (71.7 kg)  02/09/23 160 lb 1.9 oz (72.6 kg)    Lab Results  Component Value Date   TSH 0.678 12/24/2022   Lab Results  Component Value Date   WBC 4.3 12/24/2022   HGB 14.6 12/24/2022   HCT 43.8 12/24/2022   MCV 90 12/24/2022   PLT 180 12/24/2022   Lab Results  Component Value Date   NA 142 12/24/2022   K 4.0 12/24/2022   CO2 19 (L) 12/24/2022   GLUCOSE 84 12/24/2022   BUN 15 12/24/2022   CREATININE 0.81 12/24/2022   BILITOT 0.3 12/24/2022   ALKPHOS 114 12/24/2022   AST 34 12/24/2022   ALT 27 12/24/2022   PROT 7.8 12/24/2022   ALBUMIN 4.2 12/24/2022   CALCIUM 9.2 12/24/2022   ANIONGAP 8 03/12/2022   EGFR 80 12/24/2022   Lab Results  Component Value Date   CHOL 171 12/24/2022   Lab Results  Component Value Date   HDL 50 12/24/2022   Lab Results  Component Value Date   LDLCALC 104 (H) 12/24/2022   Lab Results  Component Value Date   TRIG 92 12/24/2022   Lab Results  Component Value Date   CHOLHDL 3.4 12/24/2022   Lab Results  Component Value Date   HGBA1C 6.0 (H) 12/24/2022      Assessment & Plan:  Other hyperlipidemia Assessment & Plan: She take rosuvastatin 20 mg daily and is ezetimibe 10 mg daily She denies muscle aches, constipation, and headaches Will assess lipid panel today Lab Results  Component Value Date   CHOL 171 12/24/2022   HDL 50 12/24/2022   LDLCALC 104 (H) 12/24/2022   TRIG 92 12/24/2022   CHOLHDL 3.4 12/24/2022     Orders: -     Lipid panel -     CMP14+EGFR -     CBC with Differential/Platelet  Painless hematuria Assessment & Plan: No recent episode reported She reports starting hormone replacement therapy 3 months ago by her gynecologist No complaints or concerns voiced   IFG (impaired fasting glucose) -     Hemoglobin A1c  Vitamin D deficiency -     VITAMIN D 25 Hydroxy (Vit-D Deficiency, Fractures)  Other specified hypothyroidism -     TSH  + free T4    Follow-up: Return in about 3 months (around 07/30/2023).   Gilmore Laroche, FNP

## 2023-04-30 LAB — LIPID PANEL
Chol/HDL Ratio: 2.8 ratio (ref 0.0–4.4)
Cholesterol, Total: 133 mg/dL (ref 100–199)
HDL: 47 mg/dL (ref >39)
LDL Chol Calc (NIH): 74 mg/dL (ref 0–99)
Triglycerides: 55 mg/dL (ref 0–149)
VLDL Cholesterol Cal: 12 mg/dL (ref 5–40)

## 2023-04-30 LAB — CBC WITH DIFFERENTIAL/PLATELET
Basophils Absolute: 0 10*3/uL (ref 0.0–0.2)
Basos: 1 %
EOS (ABSOLUTE): 0.2 10*3/uL (ref 0.0–0.4)
Eos: 2 %
Hematocrit: 39.6 % (ref 34.0–46.6)
Hemoglobin: 13.7 g/dL (ref 11.1–15.9)
Immature Grans (Abs): 0 10*3/uL (ref 0.0–0.1)
Immature Granulocytes: 0 %
Lymphocytes Absolute: 2.4 10*3/uL (ref 0.7–3.1)
Lymphs: 29 %
MCH: 32.5 pg (ref 26.6–33.0)
MCHC: 34.6 g/dL (ref 31.5–35.7)
MCV: 94 fL (ref 79–97)
Monocytes Absolute: 0.6 10*3/uL (ref 0.1–0.9)
Monocytes: 7 %
Neutrophils Absolute: 5.3 10*3/uL (ref 1.4–7.0)
Neutrophils: 61 %
Platelets: 335 10*3/uL (ref 150–450)
RBC: 4.21 x10E6/uL (ref 3.77–5.28)
RDW: 12.5 % (ref 11.7–15.4)
WBC: 8.5 10*3/uL (ref 3.4–10.8)

## 2023-04-30 LAB — CMP14+EGFR
ALT: 14 IU/L (ref 0–32)
AST: 14 IU/L (ref 0–40)
Albumin/Globulin Ratio: 1.3
Albumin: 4.2 g/dL (ref 3.9–4.9)
Alkaline Phosphatase: 87 IU/L (ref 44–121)
BUN/Creatinine Ratio: 12 (ref 12–28)
BUN: 11 mg/dL (ref 8–27)
Bilirubin Total: 0.3 mg/dL (ref 0.0–1.2)
CO2: 23 mmol/L (ref 20–29)
Calcium: 9.6 mg/dL (ref 8.7–10.3)
Chloride: 105 mmol/L (ref 96–106)
Creatinine, Ser: 0.91 mg/dL (ref 0.57–1.00)
Globulin, Total: 3.2 g/dL (ref 1.5–4.5)
Glucose: 82 mg/dL (ref 70–99)
Potassium: 4.5 mmol/L (ref 3.5–5.2)
Sodium: 140 mmol/L (ref 134–144)
Total Protein: 7.4 g/dL (ref 6.0–8.5)
eGFR: 69 mL/min/{1.73_m2} (ref >59)

## 2023-04-30 LAB — VITAMIN D 25 HYDROXY (VIT D DEFICIENCY, FRACTURES): Vit D, 25-Hydroxy: 42.4 ng/mL (ref 30.0–100.0)

## 2023-04-30 LAB — HEMOGLOBIN A1C
Est. average glucose Bld gHb Est-mCnc: 126 mg/dL
Hgb A1c MFr Bld: 6 % — ABNORMAL HIGH (ref 4.8–5.6)

## 2023-04-30 LAB — TSH+FREE T4
Free T4: 1.02 ng/dL (ref 0.82–1.77)
TSH: 0.967 u[IU]/mL (ref 0.450–4.500)

## 2023-04-30 NOTE — Progress Notes (Signed)
Please inform the patient that her hemoglobin A1c is 6.0.  This indicate that she has prediabetes. I recommend avoiding simple carbohydrates, including cakes, sweet desserts, ice cream, soda (diet or regular), sweet tea, candies, chips, cookies, store-bought juices, alcohol in excess of 1-2 drinks a day, lemonade, artificial sweeteners, donuts, coffee creamers, and sugar-free products.  I recommend avoiding greasy, fatty foods with increased physical activity.

## 2023-05-06 ENCOUNTER — Encounter: Payer: Self-pay | Admitting: Internal Medicine

## 2023-05-06 ENCOUNTER — Ambulatory Visit (INDEPENDENT_AMBULATORY_CARE_PROVIDER_SITE_OTHER): Payer: Medicare HMO | Admitting: Internal Medicine

## 2023-05-06 ENCOUNTER — Ambulatory Visit: Payer: Medicare HMO | Admitting: Obstetrics & Gynecology

## 2023-05-06 DIAGNOSIS — Z0000 Encounter for general adult medical examination without abnormal findings: Secondary | ICD-10-CM | POA: Diagnosis not present

## 2023-05-06 NOTE — Patient Instructions (Signed)
  Carolyn Raymond , Thank you for taking time to come for your Medicare Wellness Visit. I appreciate your ongoing commitment to your health goals. Please review the following plan we discussed and let me know if I can assist you in the future.   These are the goals we discussed:  Goals      Patient Stated     Patient would like to make good food choices.         This is a list of the screening recommended for you and due dates:  Health Maintenance  Topic Date Due   Zoster (Shingles) Vaccine (1 of 2) Never done   Colon Cancer Screening  06/21/2018   COVID-19 Vaccine (3 - 2023-24 season) 07/18/2022   Flu Shot  06/18/2023   Mammogram  04/09/2024   Medicare Annual Wellness Visit  05/05/2024   DTaP/Tdap/Td vaccine (3 - Td or Tdap) 08/11/2032   DEXA scan (bone density measurement)  Completed   Hepatitis C Screening  Completed   HPV Vaccine  Aged Out   Pneumonia Vaccine  Discontinued

## 2023-05-06 NOTE — Progress Notes (Signed)
Subjective:   Carolyn Raymond is a 67 y.o. female who presents for Medicare Annual (Subsequent) preventive examination.  Visit Complete: Virtual  I connected with  [name] on 05/06/23 by a audio enabled telemedicine application and verified that I am speaking with the correct person using two identifiers.  Patient Location: Home  Provider Location: Office/Clinic  I discussed the limitations of evaluation and management by telemedicine. The patient expressed understanding and agreed to proceed.  Patient Medicare AWV questionnaire was not completed by the patient.  Review of Systems    Review of Systems  All other systems reviewed and are negative.    Objective:   Per patient no change in vitals since last visit, unable to obtain new vitals due to telehealth visit  Today's Vitals   05/06/23 0948  PainSc: 0-No pain   There is no height or weight on file to calculate BMI.     05/06/2023    9:53 AM 06/05/2022    7:49 AM 06/03/2022    1:25 PM 05/01/2022    9:29 AM 03/12/2022   10:00 AM  Advanced Directives  Does Patient Have a Medical Advance Directive? No No No No No  Would patient like information on creating a medical advance directive? Yes (MAU/Ambulatory/Procedural Areas - Information given) No - Patient declined No - Patient declined No - Patient declined     Current Medications (verified) Outpatient Encounter Medications as of 05/06/2023  Medication Sig   Ascorbic Acid (VITAMIN C PO) Take by mouth daily.   aspirin 325 MG tablet Take 325 mg by mouth daily as needed for headache.   CALCIUM PO Take 1,200 mg by mouth daily.   cetirizine (ZYRTEC ALLERGY) 10 MG tablet Take 1 tablet (10 mg total) by mouth daily.   Cholecalciferol (VITAMIN D-3) 25 MCG (1000 UT) CAPS Take 1,000 Units by mouth daily.   ezetimibe (ZETIA) 10 MG tablet Take 1 tablet (10 mg total) by mouth daily.   Garlic 1000 MG CAPS Take 1,000 mg by mouth daily.   hydrocortisone cream 0.5 % Apply  1 Application topically 2 (two) times daily.   hydrOXYzine (VISTARIL) 25 MG capsule Take 1 capsule (25 mg total) by mouth every 8 (eight) hours as needed.   norethindrone (AYGESTIN) 5 MG tablet Take 1 tablet (5 mg total) by mouth daily.   ondansetron (ZOFRAN-ODT) 4 MG disintegrating tablet Take 1 tablet (4 mg total) by mouth every 8 (eight) hours as needed for nausea or vomiting.   rosuvastatin (CRESTOR) 20 MG tablet Take 1 tablet (20 mg total) by mouth daily. (Patient taking differently: Take 20 mg by mouth 2 (two) times a week.)   VITAMIN A PO Take 2,400 mg by mouth 4 (four) times a week.   [DISCONTINUED] diazepam (VALIUM) 5 MG tablet Take one pill about 30 minutes prior to appointment   [DISCONTINUED] ferrous sulfate 324 (65 Fe) MG TBEC Take by mouth.   No facility-administered encounter medications on file as of 05/06/2023.    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   Family History  Problem Relation Age of Onset   Pancreatic cancer Father    Social History   Socioeconomic History   Marital status: Married    Spouse name: Not on file   Number of children: Not on file   Years of education: Not on file   Highest education level: Not on file  Occupational History   Not on file   Tobacco Use   Smoking status: Never   Smokeless tobacco: Never  Vaping Use   Vaping Use: Never used  Substance and Sexual Activity   Alcohol use: Yes    Comment: occasionally   Drug use: No   Sexual activity: Yes    Birth control/protection: Post-menopausal  Other Topics Concern   Not on file  Social History Narrative   Not on file   Social Determinants of Health   Financial Resource Strain: Low Risk  (08/26/2022)   Overall Financial Resource Strain (CARDIA)    Difficulty of Paying Living Expenses: Not hard at all  Food Insecurity: No Food Insecurity (08/26/2022)   Hunger Vital Sign    Worried About Running Out of Food in the Last Year: Never true    Ran Out of Food in the Last Year: Never true  Transportation Needs: No Transportation Needs (08/26/2022)   PRAPARE - Administrator, Civil Service (Medical): No    Lack of Transportation (Non-Medical): No  Physical Activity: Sufficiently Active (05/06/2023)   Exercise Vital Sign    Days of Exercise per Week: 5 days    Minutes of Exercise per Session: 50 min  Stress: No Stress Concern Present (08/26/2022)   [employer] of Occupational Health - Occupational Stress Questionnaire    Feeling of Stress : Only a little  Social Connections: Socially Integrated (08/26/2022)   Social Connection and Isolation Panel [NHANES]    Frequency of Communication with Friends and Family: More than three times a week    Frequency of Social Gatherings with Friends and Family: Twice a week    Attends Religious Services: More than 4 times per year    Active Member of [employer] or Organizations: Yes    Attends Banker Meetings: 1 to 4 times per year    Marital Status: Married    Tobacco Counseling Counseling given: Not Answered   Clinical Intake:  Pre-visit preparation completed: Yes  Pain : No/denies pain Pain Score: 0-No pain     Nutritional Status: BMI 25 -29 Overweight Diabetes: No  How often do you need  to have someone help you when you read instructions, pamphlets, or other written materials from your doctor or pharmacy?: 1 - Never What is the last grade level you completed in school?: college  Interpreter Needed?: No      Activities of Daily Living    05/06/2023    9:49 AM 06/03/2022    1:27 PM  In your present state of health, do you have any difficulty performing the following activities:  Hearing? 0   Vision? 0   Difficulty concentrating or making decisions? 0   Walking or climbing stairs? 0   Dressing or bathing? 0   Doing errands, shopping? 0 0    Patient  Care Team: Gilmore Laroche, FNP as PCP - General (Family Medicine)  Indicate any recent Medical Services you may have received from other than [facility] providers in the past year (date may be approximate).     Assessment:   This is a routine wellness examination for [place].  Hearing/Vision screen No results found.  Dietary issues and exercise activities discussed:     Goals Addressed             This Visit's Progress    Patient Stated       Patient would like to make good food choices.        Depression Screen    05/06/2023    9:53 AM 04/29/2023    9:57 AM 02/09/2023    2:56 PM 12/24/2022   10:22 AM 09/22/2022   10:03 AM 08/26/2022    1:34 PM 08/11/2022    2:31 PM  PHQ 2/9 Scores  PHQ - 2 Score 0 0 0 0 0 0 0  PHQ- 9 Score  0 1 0  0     Fall Risk    05/06/2023    9:53 AM 04/29/2023    9:57 AM 02/09/2023    2:56 PM 12/24/2022   10:22 AM 09/22/2022   10:03 AM  Fall Risk   Falls in the past year? 0 0 0 0 0  Number falls in past yr:  0 0 0 0  Injury with Fall?  0 0 0 0  Risk for fall due to :  No Fall Risks No Fall Risks No Fall Risks No Fall Risks  Follow up  Falls evaluation completed Falls evaluation completed Falls evaluation completed Falls evaluation completed    MEDICARE RISK AT HOME:   TIMED UP AND GO:  Was the test performed?  No    Cognitive Function:    05/01/2022    9:32 AM   MMSE - Mini Mental State Exam  Not completed: Unable to complete        05/06/2023    9:54 AM 05/01/2022    9:32 AM  6CIT Screen  What Year? 0 points 0 points  What month? 0 points 0 points  What time? 0 points 0 points  Count back from 20 0 points 0 points  Months in reverse 0 points 0 points  Repeat phrase 0 points 0 points  Total Score 0 points 0 points    Immunizations Immunization History  Administered Date(s) Administered   Influenza,inj,Quad PF,6+ Mos 07/30/2016   Moderna Sars-Covid-2 Vaccination 04/03/2020, 05/03/2020   Td 01/29/2010   Tdap 08/11/2022    TDAP status: Up to date  Flu Vaccine status: Up to date  Pneumococcal vaccine status: Declined,  Education has been provided regarding the importance of this vaccine but patient still declined. Advised may receive this vaccine at local pharmacy or Health Dept. Aware to provide a copy of the vaccination record if obtained from local pharmacy or Health Dept. Verbalized acceptance and understanding.   Covid-19 vaccine status: Information provided on how to obtain vaccines.   Qualifies for Shingles Vaccine? Yes   Zostavax completed No   Shingrix Completed?: No.    Education has been provided regarding the importance of this vaccine. Patient has been advised to call insurance company to determine out of pocket expense if they have not yet received this vaccine. Advised may also receive vaccine at local pharmacy or Health Dept. Verbalized acceptance and understanding.  Screening Tests Health Maintenance  Topic Date Due   Zoster Vaccines-  Shingrix (1 of 2) Never done   Colonoscopy  06/21/2018   COVID-19 Vaccine (3 - 2023-24 season) 07/18/2022   Medicare Annual Wellness (AWV)  05/02/2023   INFLUENZA VACCINE  06/18/2023   MAMMOGRAM  04/09/2024   DTaP/Tdap/Td (3 - Td or Tdap) 08/11/2032   DEXA SCAN  Completed   Hepatitis C Screening  Completed   HPV VACCINES  Aged Out   Pneumonia Vaccine 64+ Years old  Discontinued     Health Maintenance  Health Maintenance Due  Topic Date Due   Zoster Vaccines- Shingrix (1 of 2) Never done   Colonoscopy  06/21/2018   COVID-19 Vaccine (3 - 2023-24 season) 07/18/2022   Medicare Annual Wellness (AWV)  05/02/2023    Colorectal cancer screening: Type of screening: Colonoscopy. Completed 06/21/2008. Repeat every 10 years She is going to schedule . She has been undergoing many appointments for endometrial cancer.  Mammogram status: Completed 04/09/2022. Repeat every year Patient has been referred and needs to schedule  Bone Density status: Completed 03/20/2022. Results reflect: Bone density results: OSTEOPENIA. Repeat every 2 years.  Lung Cancer Screening: (Low Dose CT Chest recommended if Age 47-80 years, 20 pack-year currently smoking OR have quit w/in 15years.) does not qualify.    Additional Screening:  Hepatitis C Screening: does not qualify; Completed 07/30/2016  Vision Screening: Recommended annual ophthalmology exams for early detection of glaucoma and other disorders of the eye. Is the patient up to date with their annual eye exam?  Yes  Who is the provider or what is the name of the office in which the patient attends annual eye exams? [facility]  If pt is not established with a provider, would they like to be referred to a provider to establish care? No .   Dental Screening: Recommended annual dental exams for proper oral hygiene   Community Resource Referral / Chronic Care Management: CRR required this visit?  No   CCM required this visit?  No     Plan:     I have personally reviewed and noted the following in the patient's chart:   Medical and social history Use of alcohol, tobacco or illicit drugs  Current medications and supplements including opioid prescriptions. Patient is not currently taking opioid prescriptions. Functional ability and status Nutritional status Physical activity Advanced directives List of other  physicians Hospitalizations, surgeries, and ER visits in previous 12 months Vitals Screenings to include cognitive, depression, and falls Referrals and appointments  In addition, I have reviewed and discussed with patient certain preventive protocols, quality metrics, and best practice recommendations. A written personalized care plan for preventive services as well as general preventive health recommendations were provided to patient.     Milus Banister, MD   05/06/2023   After Visit Summary: (MyChart) Due to this being a telephonic visit, the after visit summary with patients personalized plan was offered to patient via MyChart

## 2023-05-07 ENCOUNTER — Other Ambulatory Visit: Payer: Medicare HMO

## 2023-05-07 ENCOUNTER — Ambulatory Visit: Payer: Medicare HMO | Admitting: Obstetrics & Gynecology

## 2023-05-26 ENCOUNTER — Other Ambulatory Visit (HOSPITAL_COMMUNITY): Payer: Self-pay | Admitting: Family Medicine

## 2023-05-26 DIAGNOSIS — Z1231 Encounter for screening mammogram for malignant neoplasm of breast: Secondary | ICD-10-CM

## 2023-06-01 ENCOUNTER — Ambulatory Visit (INDEPENDENT_AMBULATORY_CARE_PROVIDER_SITE_OTHER): Payer: Medicare HMO

## 2023-06-01 ENCOUNTER — Ambulatory Visit: Payer: Medicare HMO | Admitting: Obstetrics & Gynecology

## 2023-06-01 ENCOUNTER — Encounter: Payer: Self-pay | Admitting: Obstetrics & Gynecology

## 2023-06-01 VITALS — BP 136/79 | HR 71 | Ht 64.5 in

## 2023-06-01 DIAGNOSIS — N950 Postmenopausal bleeding: Secondary | ICD-10-CM | POA: Diagnosis not present

## 2023-06-01 DIAGNOSIS — R9389 Abnormal findings on diagnostic imaging of other specified body structures: Secondary | ICD-10-CM

## 2023-06-01 MED ORDER — NORETHINDRONE ACETATE 5 MG PO TABS: ORAL_TABLET | ORAL | 4 refills | Status: DC

## 2023-06-01 NOTE — Progress Notes (Signed)
PELVIC US TA/TV: heterogeneous anteverted uterus,posterior myometrial calcification 4.7 mm,homogeneous thickened endometrium (no color flow) 7.5 mm,normal ovaries,no free fluid,no pain during ultrasound  Chaperone Peggy

## 2023-06-01 NOTE — Progress Notes (Signed)
   GYN VISIT Patient name: Carolyn Raymond MRN [id number]  Date of birth: 28-Jun-1956 Chief Complaint:   U/S Today  History of Present Illness:   Carolyn Raymond is a 67 y.o. [phone] PM female being seen today for follow up regarding:  -PMB/proliferative endometrium: To review, pt initially had PMB in Oct 2023.  EMB at that time was negative with proliferative endometrium.  She then had another episode of bleeding in April 2024.  Plan was made to start on Aygestin daily. She is currently taking 1/2 tab daily.  She denies any vaginal bleeding since being seen in April.  Denies irregular discharge, itching or irritation.  Denies pelvic or abdominal pain.  Reports no acute GYN concern  [place] today: heterogeneous anteverted uterus,posterior myometrial calcification 4.7 mm,homogeneous thickened endometrium (no color flow) 7.5 mm,normal ovaries,no free fluid,no pain during ultrasound   No LMP recorded. Patient is postmenopausal.    Review of Systems:   Pertinent items are noted in HPI Denies fever/chills, dizziness, headaches, visual disturbances, fatigue, shortness of breath, chest pain, abdominal pain, vomiting, no problems with bowel movements, urination, or intercourse unless otherwise stated above.  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    Past Medical History:  Diagnosis Date   Allergy    Hyperlipidemia    Thyroid  disease    nodules   Reviewed problem list, medications and allergies. Physical Assessment:   Vitals:   06/01/23 1604  BP: 136/79  Pulse: 71  Height: 5' 4.5" (1.638 m)  Body mass index is 26.89 kg/m.       Physical Examination:   General appearance: alert, well appearing, and in no distress  Psych: mood appropriate, normal affect  Skin: warm & dry   Cardiovascular: normal heart rate noted  Respiratory: normal respiratory effort, no distress  Abdomen: soft, non-tender   Pelvic: examination not indicated  Extremities: no edema   Chaperone: N/A    Assessment & Plan:  1) Proliferative endometrium/postmenopausal bleeding -baseline [place] completed today, reviewed today's [place] -plan to continue with progesterone treatment -should she note return of bleeding or any acute changes, will plan for repeat EMB or proceed with HSC,D&C -questions/concerns were addressed  Meds ordered this encounter  Medications   norethindrone (AYGESTIN) 5 MG tablet    Sig: Take 1/2 tablet daily    Dispense:  90 tablet    Refill:  4      Return in about 1 year (around 05/31/2024) for Annual.   Myna Hidalgo, DO Attending Obstetrician & Gynecologist, [facility], [facility]

## 2023-06-04 ENCOUNTER — Ambulatory Visit (HOSPITAL_COMMUNITY)
Admission: RE | Admit: 2023-06-04 | Discharge: 2023-06-04 | Disposition: A | Discharge status: Home / Self Care | Payer: Medicare HMO | Source: Ambulatory Visit | Attending: Family Medicine | Admitting: Family Medicine

## 2023-06-04 DIAGNOSIS — Z1231 Encounter for screening mammogram for malignant neoplasm of breast: Secondary | ICD-10-CM | POA: Insufficient documentation

## 2023-06-18 ENCOUNTER — Other Ambulatory Visit: Payer: Self-pay | Admitting: Family Medicine

## 2023-06-18 DIAGNOSIS — E7849 Other hyperlipidemia: Secondary | ICD-10-CM

## 2023-06-23 ENCOUNTER — Telehealth: Payer: Self-pay | Admitting: Family Medicine

## 2023-06-23 ENCOUNTER — Other Ambulatory Visit: Payer: Self-pay

## 2023-06-23 ENCOUNTER — Other Ambulatory Visit: Payer: Self-pay | Admitting: Family Medicine

## 2023-06-23 DIAGNOSIS — E7849 Other hyperlipidemia: Secondary | ICD-10-CM

## 2023-06-23 NOTE — Telephone Encounter (Signed)
Pt asking if based on her last labs should she continue rosuvastatin ? Please advice?

## 2023-06-23 NOTE — Telephone Encounter (Signed)
Patient calling wanting to speak with somebody regarding questions about rosuvastatin (CRESTOR) 20 MG tablet [[id number]] Please advise Thank you

## 2023-06-24 NOTE — Telephone Encounter (Signed)
Yes, I recommend she reduce her intake of saturated fats, trans fats, and cholesterol, and continue taking rosuvastatin 20 mg daily.

## 2023-06-25 NOTE — Telephone Encounter (Signed)
Pt informed

## 2023-07-15 DIAGNOSIS — E785 Hyperlipidemia, unspecified: Secondary | ICD-10-CM | POA: Diagnosis not present

## 2023-07-15 DIAGNOSIS — M8580 Other specified disorders of bone density and structure, unspecified site: Secondary | ICD-10-CM | POA: Diagnosis not present

## 2023-07-15 DIAGNOSIS — N912 Amenorrhea, unspecified: Secondary | ICD-10-CM | POA: Diagnosis not present

## 2023-07-15 DIAGNOSIS — Z91041 Radiographic dye allergy status: Secondary | ICD-10-CM | POA: Diagnosis not present

## 2023-07-15 DIAGNOSIS — Z008 Encounter for other general examination: Secondary | ICD-10-CM | POA: Diagnosis not present

## 2023-07-15 DIAGNOSIS — Z793 Long term (current) use of hormonal contraceptives: Secondary | ICD-10-CM | POA: Diagnosis not present

## 2023-07-15 DIAGNOSIS — Z8249 Family history of ischemic heart disease and other diseases of the circulatory system: Secondary | ICD-10-CM | POA: Diagnosis not present

## 2023-07-15 DIAGNOSIS — R030 Elevated blood-pressure reading, without diagnosis of hypertension: Secondary | ICD-10-CM | POA: Diagnosis not present

## 2023-07-15 DIAGNOSIS — Z809 Family history of malignant neoplasm, unspecified: Secondary | ICD-10-CM | POA: Diagnosis not present

## 2023-07-15 DIAGNOSIS — Z833 Family history of diabetes mellitus: Secondary | ICD-10-CM | POA: Diagnosis not present

## 2023-07-15 DIAGNOSIS — Z823 Family history of stroke: Secondary | ICD-10-CM | POA: Diagnosis not present

## 2023-07-29 ENCOUNTER — Encounter: Payer: Self-pay | Admitting: Family Medicine

## 2023-07-29 ENCOUNTER — Ambulatory Visit (INDEPENDENT_AMBULATORY_CARE_PROVIDER_SITE_OTHER): Payer: Medicare HMO | Admitting: Family Medicine

## 2023-07-29 VITALS — BP 129/89 | HR 82 | Ht 64.5 in | Wt 159.0 lb

## 2023-07-29 DIAGNOSIS — R7301 Impaired fasting glucose: Secondary | ICD-10-CM

## 2023-07-29 DIAGNOSIS — E038 Other specified hypothyroidism: Secondary | ICD-10-CM

## 2023-07-29 DIAGNOSIS — R7303 Prediabetes: Secondary | ICD-10-CM | POA: Diagnosis not present

## 2023-07-29 DIAGNOSIS — E559 Vitamin D deficiency, unspecified: Secondary | ICD-10-CM

## 2023-07-29 DIAGNOSIS — E7849 Other hyperlipidemia: Secondary | ICD-10-CM

## 2023-07-29 MED ORDER — EZETIMIBE 10 MG PO TABS
10.0000 mg | ORAL_TABLET | Freq: Every day | ORAL | 3 refills | Status: DC
Start: 2023-07-29 — End: 2023-12-03

## 2023-07-29 NOTE — Patient Instructions (Signed)
I appreciate the opportunity to provide care to you today!    Follow up:  4 months  Labs: please stop by the lab 2-3 days before you next appointment to get your blood drawn (CBC, CMP, TSH, Lipid profile, HgA1c, Vit D)  For managing prediabetes, I recommend the following lifestyle changes:  Reduce Intake of High-Sugar Foods and Beverages: Limit foods and drinks high in sugar to help regulate blood sugar levels. Increase Consumption of Nutrient-Rich Foods: Focus on incorporating more fruits, vegetables, and whole grains into your diet. Choose Lean Proteins: Opt for lean proteins such as chicken, fish, beans, and legumes. Select Low-Fat Dairy Products: Choose low-fat or non-fat dairy options. Minimize Saturated Fats, Trans Fats, and Cholesterol: Reduce intake of foods high in saturated fats, trans fatty acids, and cholesterol. Engage in Regular Physical Activity: Aim for at least 30 minutes of brisk walking or other moderate activity at least 5 days a week.     Please continue to a heart-healthy diet and increase your physical activities. Try to exercise for at least five days a week.    It was a pleasure to see you and I look forward to continuing to work together on your health and well-being. Please do not hesitate to call the office if you need care or have questions about your care.  In case of emergency, please visit the Emergency Department for urgent care, or contact our clinic at [phone] to schedule an appointment. We're here to help you!   Have a wonderful day and week. With Gratitude, Gilmore Laroche MSN, FNP-BC

## 2023-07-29 NOTE — Assessment & Plan Note (Signed)
 For managing prediabetes, I recommend the following lifestyle changes:  Reduce Intake of High-Sugar Foods and Beverages: Limit foods and drinks high in sugar to help regulate blood sugar levels. Increase Consumption of Nutrient-Rich Foods: Focus on incorporating more fruits, vegetables, and whole grains into your diet. Choose Lean Proteins: Opt for lean proteins such as chicken, fish, beans, and legumes. Select Low-Fat Dairy Products: Choose low-fat or non-fat dairy options. Minimize Saturated Fats, Trans Fats, and Cholesterol: Reduce intake of foods high in saturated fats, trans fatty acids, and cholesterol. Engage in Regular Physical Activity: Aim for at least 30 minutes of brisk walking or other moderate activity at least 5 days a week.

## 2023-07-29 NOTE — Progress Notes (Signed)
Established Patient Office Visit  Subjective:  Patient ID: Carolyn Raymond, female    DOB: 25-Jul-1956  Age: 67 y.o. MRN: [id number]  CC:  Chief Complaint  Patient presents with   Care Management    Follow up appt, would like to discuss vitamins.     HPI Carolyn Raymond is a 66 y.o. female with past medical history of hyperlipidemia, and prediabetes presents for f/u of  chronic medical conditions. For the details of today's visit, please refer to the assessment and plan.     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    Family History  Problem Relation Age of Onset   Pancreatic cancer Father     Social History   Socioeconomic History   Marital status: Married    Spouse name: Not on file   Number of children: Not on file   Years of education: Not on file   Highest education level: Not on file  Occupational History   Not on file  Tobacco Use   Smoking status: Never   Smokeless tobacco: Never  Vaping Use   Vaping status: Never Used  Substance and Sexual Activity   Alcohol use: Yes    Comment: occasionally   Drug use: No   Sexual activity: Yes    Birth control/protection: Post-menopausal  Other Topics Concern   Not on file  Social History Narrative   Not on file   Social Determinants of Health   Financial Resource Strain:  Low Risk  (08/26/2022)   Overall Financial Resource Strain (CARDIA)    Difficulty of Paying Living Expenses: Not hard at all  Food Insecurity: No Food Insecurity (08/26/2022)   Hunger Vital Sign    Worried About Running Out of Food in the Last Year: Never true    Ran Out of Food in the Last Year: Never true  Transportation Needs: No Transportation Needs (08/26/2022)   PRAPARE - Administrator, Civil Service (Medical): No    Lack of Transportation (Non-Medical): No  Physical Activity: Sufficiently Active (05/06/2023)   Exercise Vital Sign    Days of Exercise per Week: 5 days    Minutes of Exercise per Session: 50 min  Stress: No Stress Concern Present (08/26/2022)   [employer] of Occupational Health - Occupational Stress Questionnaire    Feeling of Stress : Only a little  Social Connections: Socially Integrated (08/26/2022)   Social Connection and Isolation Panel [NHANES]    Frequency of Communication with Friends and Family: More than three times a week    Frequency of Social Gatherings with Friends and Family: Twice a week    Attends Religious Services: More than 4 times per year    Active Member of [employer] or Organizations: Yes    Attends Banker Meetings: 1  to 4 times per year    Marital Status: Married  Catering manager Violence: Not At Risk (08/26/2022)   Humiliation, Afraid, Rape, and Kick questionnaire    Fear of Current or Ex-Partner: No    Emotionally Abused: No    Physically Abused: No    Sexually Abused: No    Outpatient Medications Prior to Visit  Medication Sig Dispense Refill   Ascorbic Acid (VITAMIN C PO) Take by mouth daily.     aspirin 325 MG tablet Take 325 mg by mouth daily as needed for headache.     CALCIUM PO Take 1,200 mg by mouth daily.     cetirizine (ZYRTEC ALLERGY) 10 MG tablet Take 1 tablet (10 mg total) by mouth daily. 30 tablet 0   Cholecalciferol (VITAMIN D-3) 25 MCG (1000 UT) CAPS Take 1,000 Units by mouth daily.      Garlic 1000 MG CAPS Take 1,000 mg by mouth daily.     hydrocortisone cream 0.5 % Apply 1 Application topically 2 (two) times daily. 30 g 0   hydrOXYzine (VISTARIL) 25 MG capsule Take 1 capsule (25 mg total) by mouth every 8 (eight) hours as needed. 30 capsule 0   norethindrone (AYGESTIN) 5 MG tablet Take 1/2 tablet daily 90 tablet 4   ondansetron (ZOFRAN-ODT) 4 MG disintegrating tablet Take 1 tablet (4 mg total) by mouth every 8 (eight) hours as needed for nausea or vomiting. 20 tablet 0   rosuvastatin (CRESTOR) 20 MG tablet Take 1 tablet by mouth once daily 90 tablet 0   VITAMIN A PO Take 2,400 mg by mouth 4 (four) times a week.     ezetimibe (ZETIA) 10 MG tablet Take 1 tablet (10 mg total) by mouth daily. 90 tablet 3   No facility-administered medications prior to visit.    Allergies  Allergen Reactions   Bee Pollen Swelling    Can always tell when they are around   Isovue [Iopamidol] Itching and Swelling    ROS Review of Systems  Constitutional:  Negative for chills and fever.  Eyes:  Negative for visual disturbance.  Respiratory:  Negative for chest tightness and shortness of breath.   Neurological:  Negative for dizziness and headaches.      Objective:    Physical Exam HENT:     Head: Normocephalic.     Mouth/Throat:     Mouth: Mucous membranes are moist.  Cardiovascular:     Rate and Rhythm: Normal rate.     Heart sounds: Normal heart sounds.  Pulmonary:     Effort: Pulmonary effort is normal.     Breath sounds: Normal breath sounds.  Neurological:     Mental Status: She is alert.     BP 129/89   Pulse 82   Ht 5' 4.5" (1.638 m)   Wt 159 lb (72.1 kg)   SpO2 95%   BMI 26.87 kg/m  Wt Readings from Last 3 Encounters:  07/29/23 159 lb (72.1 kg)  04/29/23 159 lb 1.9 oz (72.2 kg)  03/06/23 158 lb (71.7 kg)    Lab Results  Component Value Date   TSH 0.967 04/29/2023   Lab Results  Component Value Date   WBC 8.5 04/29/2023   HGB 13.7 04/29/2023   HCT  39.6 04/29/2023   MCV 94 04/29/2023   PLT 335 04/29/2023   Lab Results  Component Value Date   NA 140 04/29/2023   K 4.5 04/29/2023   CO2 23 04/29/2023   GLUCOSE 82 04/29/2023  BUN 11 04/29/2023   CREATININE 0.91 04/29/2023   BILITOT 0.3 04/29/2023   ALKPHOS 87 04/29/2023   AST 14 04/29/2023   ALT 14 04/29/2023   PROT 7.4 04/29/2023   ALBUMIN 4.2 04/29/2023   CALCIUM 9.6 04/29/2023   ANIONGAP 8 03/12/2022   EGFR 69 04/29/2023   Lab Results  Component Value Date   CHOL 133 04/29/2023   Lab Results  Component Value Date   HDL 47 04/29/2023   Lab Results  Component Value Date   LDLCALC 74 04/29/2023   Lab Results  Component Value Date   TRIG 55 04/29/2023   Lab Results  Component Value Date   CHOLHDL 2.8 04/29/2023   Lab Results  Component Value Date   HGBA1C 6.0 (H) 04/29/2023      Assessment & Plan:  Prediabetes Assessment & Plan: For managing prediabetes, I recommend the following lifestyle changes:  Reduce Intake of High-Sugar Foods and Beverages: Limit foods and drinks high in sugar to help regulate blood sugar levels. Increase Consumption of Nutrient-Rich Foods: Focus on incorporating more fruits, vegetables, and whole grains into your diet. Choose Lean Proteins: Opt for lean proteins such as chicken, fish, beans, and legumes. Select Low-Fat Dairy Products: Choose low-fat or non-fat dairy options. Minimize Saturated Fats, Trans Fats, and Cholesterol: Reduce intake of foods high in saturated fats, trans fatty acids, and cholesterol. Engage in Regular Physical Activity: Aim for at least 30 minutes of brisk walking or other moderate activity at least 5 days a week.    Other hyperlipidemia Assessment & Plan: She take rosuvastatin 20 mg daily and is ezetimibe 10 mg daily She denies muscle aches, constipation, and headaches Encouraged a heart healthy diet with increase physical activity Lab Results  Component Value Date   CHOL 133 04/29/2023   HDL 47  04/29/2023   LDLCALC 74 04/29/2023   TRIG 55 04/29/2023   CHOLHDL 2.8 04/29/2023     Orders: -     Lipid panel -     CMP14+EGFR -     CBC with Differential/Platelet -     Ezetimibe; Take 1 tablet (10 mg total) by mouth daily.  Dispense: 90 tablet; Refill: 3  IFG (impaired fasting glucose) -     Hemoglobin A1c  Vitamin D deficiency -     VITAMIN D 25 Hydroxy (Vit-D Deficiency, Fractures)  Other specified hypothyroidism -     TSH + free T4  Note: This chart has been completed using Engineer, civil (consulting) software, and while attempts have been made to ensure accuracy, certain words and phrases may not be transcribed as intended.    Follow-up: Return in about 4 months (around 11/28/2023).   Gilmore Laroche, FNP

## 2023-07-29 NOTE — Assessment & Plan Note (Signed)
She take rosuvastatin 20 mg daily and is ezetimibe 10 mg daily She denies muscle aches, constipation, and headaches Encouraged a heart healthy diet with increase physical activity Lab Results  Component Value Date   CHOL 133 04/29/2023   HDL 47 04/29/2023   LDLCALC 74 04/29/2023   TRIG 55 04/29/2023   CHOLHDL 2.8 04/29/2023

## 2023-08-31 ENCOUNTER — Ambulatory Visit (INDEPENDENT_AMBULATORY_CARE_PROVIDER_SITE_OTHER)
Admission: EM | Admit: 2023-08-31 | Discharge: 2023-08-31 | Disposition: A | Discharge status: Home / Self Care | Payer: Medicare HMO | Source: Home / Self Care

## 2023-08-31 ENCOUNTER — Encounter (HOSPITAL_COMMUNITY): Payer: Self-pay

## 2023-08-31 ENCOUNTER — Emergency Department (HOSPITAL_COMMUNITY)
Admission: EM | Admit: 2023-08-31 | Discharge: 2023-09-01 | Disposition: A | Discharge status: Home / Self Care | Payer: Medicare HMO | Attending: Emergency Medicine | Admitting: Emergency Medicine

## 2023-08-31 ENCOUNTER — Other Ambulatory Visit: Payer: Self-pay

## 2023-08-31 ENCOUNTER — Ambulatory Visit (HOSPITAL_COMMUNITY)
Admit: 2023-08-31 | Discharge: 2023-08-31 | Disposition: A | Discharge status: Home / Self Care | Payer: Medicare HMO | Attending: Family Medicine | Admitting: Family Medicine

## 2023-08-31 DIAGNOSIS — E785 Hyperlipidemia, unspecified: Secondary | ICD-10-CM | POA: Insufficient documentation

## 2023-08-31 DIAGNOSIS — R600 Localized edema: Secondary | ICD-10-CM | POA: Diagnosis not present

## 2023-08-31 DIAGNOSIS — Z9049 Acquired absence of other specified parts of digestive tract: Secondary | ICD-10-CM | POA: Insufficient documentation

## 2023-08-31 DIAGNOSIS — I82412 Acute embolism and thrombosis of left femoral vein: Secondary | ICD-10-CM | POA: Insufficient documentation

## 2023-08-31 DIAGNOSIS — I82452 Acute embolism and thrombosis of left peroneal vein: Secondary | ICD-10-CM | POA: Diagnosis not present

## 2023-08-31 DIAGNOSIS — M79605 Pain in left leg: Secondary | ICD-10-CM | POA: Diagnosis not present

## 2023-08-31 DIAGNOSIS — R7303 Prediabetes: Secondary | ICD-10-CM | POA: Diagnosis not present

## 2023-08-31 DIAGNOSIS — Z905 Acquired absence of kidney: Secondary | ICD-10-CM | POA: Insufficient documentation

## 2023-08-31 DIAGNOSIS — I82432 Acute embolism and thrombosis of left popliteal vein: Secondary | ICD-10-CM | POA: Diagnosis not present

## 2023-08-31 LAB — CBC WITH DIFFERENTIAL/PLATELET
Abs Immature Granulocytes: 0.05 10*3/uL (ref 0.00–0.07)
Basophils Absolute: 0 10*3/uL (ref 0.0–0.1)
Basophils Relative: 0 %
Eosinophils Absolute: 0 10*3/uL (ref 0.0–0.5)
Eosinophils Relative: 0 %
HCT: 42.8 % (ref 36.0–46.0)
Hemoglobin: 13.7 g/dL (ref 12.0–15.0)
Immature Granulocytes: 0 %
Lymphocytes Relative: 14 %
Lymphs Abs: 1.9 10*3/uL (ref 0.7–4.0)
MCH: 30.3 pg (ref 26.0–34.0)
MCHC: 32 g/dL (ref 30.0–36.0)
MCV: 94.7 fL (ref 80.0–100.0)
Monocytes Absolute: 0.8 10*3/uL (ref 0.1–1.0)
Monocytes Relative: 6 %
Neutro Abs: 10.4 10*3/uL — ABNORMAL HIGH (ref 1.7–7.7)
Neutrophils Relative %: 80 %
Platelets: 220 10*3/uL (ref 150–400)
RBC: 4.52 MIL/uL (ref 3.87–5.11)
RDW: 13.7 % (ref 11.5–15.5)
WBC: 13.1 10*3/uL — ABNORMAL HIGH (ref 4.0–10.5)
nRBC: 0 % (ref 0.0–0.2)

## 2023-08-31 LAB — COMPREHENSIVE METABOLIC PANEL
ALT: 16 U/L (ref 0–44)
AST: 15 U/L (ref 15–41)
Albumin: 3.8 g/dL (ref 3.5–5.0)
Alkaline Phosphatase: 81 U/L (ref 38–126)
Anion gap: 10 (ref 5–15)
BUN: 8 mg/dL (ref 8–23)
CO2: 26 mmol/L (ref 22–32)
Calcium: 9.2 mg/dL (ref 8.9–10.3)
Chloride: 101 mmol/L (ref 98–111)
Creatinine, Ser: 0.72 mg/dL (ref 0.44–1.00)
GFR, Estimated: >60 mL/min (ref >60)
Glucose, Bld: 91 mg/dL (ref 70–99)
Potassium: 4 mmol/L (ref 3.5–5.1)
Sodium: 137 mmol/L (ref 135–145)
Total Bilirubin: 1 mg/dL (ref 0.3–1.2)
Total Protein: 8.5 g/dL — ABNORMAL HIGH (ref 6.5–8.1)

## 2023-08-31 LAB — APTT: aPTT: 30 s (ref 24–36)

## 2023-08-31 NOTE — ED Provider Notes (Signed)
[facility]    CSN: [id number] Arrival date & time: 08/31/23  1310      History   Chief Complaint Chief Complaint  Patient presents with   Leg Swelling    HPI Carolyn Raymond is a 67 y.o. female.   Patient presenting today with 3-day history of left ankle pain and swelling now extending up to the top of the left leg.  States her pain is currently a 10 out of 10.  Denies redness, warmth, palpitations, chest pain, shortness of breath, injury to the area, history of DVT or PE, new medications, recent surgery, recent long sedentary trips, hormonal therapy.  She does states she has had to have a stent placed in a vein in this leg in the past but has never had swelling like this prior.  Trying compression stockings and elevation with minimal relief.    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   Vitamin D deficiency 07/11/2013   Abdominal pain, chronic, right lower quadrant 07/05/2013   Routine general medical examination at a health care facility 07/04/2013   FATIGUE 01/29/2010   GOITER, NONTOXIC MULTINODULAR 05/30/2008   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/30/2008   Hyperlipidemia 11/02/2006   Allergic rhinitis 11/02/2006   GERD 11/02/2006    Past Surgical History:  Procedure Laterality Date   CHOLECYSTECTOMY  07/18/1998   CYSTOSCOPY WITH BIOPSY N/A 06/05/2022   Procedure: CYSTOSCOPY WITH BIOPSY;  Surgeon: Malen Gauze, MD;  Location: [facility];  Service: Urology;  Laterality: N/A;   CYSTOSCOPY WITH FULGERATION N/A 06/05/2022   Procedure: CYSTOSCOPY WITH FULGERATION;   Surgeon: Malen Gauze, MD;  Location: [facility];  Service: Urology;  Laterality: N/A;   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    OB History     Gravida  2   Para  2   Term  2   Preterm      AB      Living  2      SAB      IAB      Ectopic      Multiple      Live Births               Home Medications    Prior to Admission medications   Medication Sig Start Date End Date Taking? Authorizing Provider  Ascorbic Acid (VITAMIN C PO) Take by mouth daily.   Yes [provider]  CALCIUM PO Take 1,200 mg by mouth daily.   Yes [provider]  Cholecalciferol (VITAMIN D-3) 25 MCG (1000 UT) CAPS Take 1,000 Units by mouth daily.   Yes [provider]  ezetimibe (ZETIA) 10 MG tablet Take 1 tablet (10 mg total) by mouth daily. 07/29/23  Yes Gilmore Laroche, FNP  Garlic 1000 MG CAPS Take 1,000 mg by mouth daily.   Yes [provider]  norethindrone (AYGESTIN) 5 MG tablet Take 1/2 tablet daily 06/01/23  Yes Myna Hidalgo,  DO  rosuvastatin (CRESTOR) 20 MG tablet Take 1 tablet by mouth once daily 06/19/23  Yes Gilmore Laroche, FNP  VITAMIN A PO Take 2,400 mg by mouth 4 (four) times a week.   Yes [provider]  aspirin 325 MG tablet Take 325 mg by mouth daily as needed for headache.    [provider]  cetirizine (ZYRTEC ALLERGY) 10 MG tablet Take 1 tablet (10 mg total) by mouth daily. 01/16/21   Avegno, Zachery Dakins, FNP  hydrocortisone cream 0.5 % Apply 1 Application topically 2 (two) times daily. 05/27/22   Gilmore Laroche, FNP  hydrOXYzine (VISTARIL) 25 MG capsule Take 1 capsule (25 mg total) by mouth every 8 (eight) hours as needed. 05/27/22   Gilmore Laroche, FNP  ondansetron (ZOFRAN-ODT) 4 MG disintegrating tablet Take 1 tablet (4 mg total) by mouth every 8 (eight) hours as needed for nausea or vomiting. 10/21/21    Elson Areas, PA-C    Family History Family History  Problem Relation Age of Onset   Pancreatic cancer Father     Social History Social History   Tobacco Use   Smoking status: Never   Smokeless tobacco: Never  Vaping Use   Vaping status: Never Used  Substance Use Topics   Alcohol use: Yes    Comment: occasionally   Drug use: No     Allergies   Bee pollen and Isovue [iopamidol]   Review of Systems Review of Systems PER HPI  Physical Exam Triage Vital Signs ED Triage Vitals  Encounter Vitals Group     BP 08/31/23 1434 (!) 147/95     Systolic BP Percentile --      Diastolic BP Percentile --      Pulse Rate 08/31/23 1434 98     Resp 08/31/23 1434 18     Temp 08/31/23 1434 98.5 F (36.9 C)     Temp Source 08/31/23 1434 Oral     SpO2 08/31/23 1434 96 %     Weight --      Height --      Head Circumference --      Peak Flow --      Pain Score 08/31/23 1439 8     Pain Loc --      Pain Education --      Exclude from Growth Chart --    No data found.  Updated Vital Signs BP (!) 147/95 (BP Location: Right Arm)   Pulse 98   Temp 98.5 F (36.9 C) (Oral)   Resp 18   SpO2 96%   Visual Acuity Right Eye Distance:   Left Eye Distance:   Bilateral Distance:    Right Eye Near:   Left Eye Near:    Bilateral Near:     Physical Exam Vitals and nursing note reviewed.  Constitutional:      Appearance: Normal appearance. She is not ill-appearing.  HENT:     Head: Atraumatic.  Eyes:     Extraocular Movements: Extraocular movements intact.     Conjunctiva/sclera: Conjunctivae normal.  Cardiovascular:     Rate and Rhythm: Normal rate and regular rhythm.     Heart sounds: Normal heart sounds.  Pulmonary:     Effort: Pulmonary effort is normal.     Breath sounds: Normal breath sounds.  Musculoskeletal:        General: Swelling and tenderness present. Normal range of motion.     Cervical back: Normal range of motion and neck supple.  Comments: Diffuse  2+ edema to left lower extremity, tender to palpation diffusely.  Negative Homans' sign and squeeze test  Skin:    General: Skin is warm and dry.     Coloration: Skin is not pale.     Findings: No bruising or erythema.  Neurological:     Mental Status: She is alert and oriented to person, place, and time.     Motor: No weakness.     Gait: Gait normal.     Comments: Left lower extremity neurovascularly intact  Psychiatric:        Mood and Affect: Mood normal.        Thought Content: Thought content normal.        Judgment: Judgment normal.      UC Treatments / Results  Labs (all labs ordered are listed, but only abnormal results are displayed) Labs Reviewed - No data to display  EKG   Radiology No results found.  Procedures Procedures (including critical care time)  Medications Ordered in UC Medications - No data to display  Initial Impression / Assessment and Plan / UC Course  I have reviewed the triage vital signs and the nursing notes.  Pertinent labs & imaging results that were available during my care of the patient were reviewed by me and considered in my medical decision making (see chart for details).     Unclear etiology of the unilateral severely painful swelling, will rule out DVT with ultrasound that we are able to schedule as soon as patient leaves clinic today and follow-up with primary care for recheck if negative.  ED for worsening symptoms at any time.  Elevation, compression, hydration reviewed.  Final Clinical Impressions(s) / UC Diagnoses   Final diagnoses:  Leg edema, left  Pain of left lower extremity     Discharge Instructions      Go to the [facility] imaging center at 315 for your ultrasound.  Follow-up with your primary care provider as soon as possible for a recheck and further evaluation especially if ultrasound is negative.  Continue compression, elevation, Tylenol as needed for pain.    ED Prescriptions   None    PDMP not  reviewed this encounter.   Particia Nearing, [place] 08/31/23 1531

## 2023-08-31 NOTE — ED Triage Notes (Signed)
Pt states her left ankle started swelling Saturday, with pain, swelling has now gone up to her upper thigh/groin. Tried elevating her foot and compression hose with no relief of symptoms.

## 2023-08-31 NOTE — ED Notes (Signed)
Per providers instructions central scheduling was contacted to schedule DVT ultrasound for pt. Appointment is scheduled for 3:15 pm today. Provider is aware.

## 2023-08-31 NOTE — ED Triage Notes (Signed)
Left leg pain and swelling started Saturday. Mainly in the groin area.   Pt stated she was seen at urgent care and had and [place], was told she has a DVT to come to ED for RX.   Denies numbness in foot.

## 2023-08-31 NOTE — ED Notes (Signed)
Noted [place] has been completed, not read

## 2023-08-31 NOTE — ED Notes (Signed)
Warm blankets given to husband and pt for comfort

## 2023-08-31 NOTE — Discharge Instructions (Signed)
Go to the [facility] imaging center at 315 for your ultrasound.  Follow-up with your primary care provider as soon as possible for a recheck and further evaluation especially if ultrasound is negative.  Continue compression, elevation, Tylenol as needed for pain.

## 2023-08-31 NOTE — ED Notes (Signed)
Reassessed pt Noted [place] has been read and pt has a DVT Pt stated pain is 4/10 Vitals listed

## 2023-09-01 ENCOUNTER — Other Ambulatory Visit: Payer: Self-pay | Admitting: Family Medicine

## 2023-09-01 ENCOUNTER — Telehealth: Payer: Self-pay

## 2023-09-01 DIAGNOSIS — I82409 Acute embolism and thrombosis of unspecified deep veins of unspecified lower extremity: Secondary | ICD-10-CM

## 2023-09-01 MED ORDER — APIXABAN (ELIQUIS) VTE STARTER PACK (10MG AND 5MG)
ORAL_TABLET | ORAL | 0 refills | Status: DC
Start: 2023-09-01 — End: 2023-09-25

## 2023-09-01 MED ORDER — APIXABAN (ELIQUIS) VTE STARTER PACK (10MG AND 5MG): ORAL_TABLET | ORAL | 0 refills | Status: DC

## 2023-09-01 MED ORDER — APIXABAN 5 MG PO TABS
10.0000 mg | ORAL_TABLET | Freq: Once | ORAL | Status: AC
  Administered 2023-09-01: 10 mg via ORAL
  Filled 2023-09-01: qty 2

## 2023-09-01 NOTE — ED Provider Notes (Signed)
[facility] Emergency Department Provider Note MRN:  [id number]  Arrival date & time: 09/01/23     Chief Complaint   Leg Pain   History of Present Illness   Carolyn Raymond is a 67 y.o. year-old female with no pertinent past medical history presenting to the ED with chief complaint of leg pain.  Pain and swelling to the left leg, denies trauma.  No chest pain or shortness of breath.  No fever.  Has been driving to and from [facility] often recently due to family funerals.  Review of Systems  A thorough review of systems was obtained and all systems are negative except as noted in the HPI and PMH.   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    Family History  Problem Relation Age of Onset   Pancreatic cancer Father     Social History   Socioeconomic History   Marital status: Married    Spouse name: Not on file   Number of children: Not on file   Years of education: Not on file   Highest education level: Not on file  Occupational History   Not on file  Tobacco Use   Smoking status: Never   Smokeless tobacco: Never  Vaping Use   Vaping status: Never Used  Substance and Sexual Activity   Alcohol use: Yes    Comment: occasionally   Drug use: No   Sexual activity: Yes    Birth  control/protection: Post-menopausal  Other Topics Concern   Not on file  Social History Narrative   Not on file   Social Determinants of Health   Financial Resource Strain: Low Risk  (08/26/2022)   Overall Financial Resource Strain (CARDIA)    Difficulty of Paying Living Expenses: Not hard at all  Food Insecurity: No Food Insecurity (08/26/2022)   Hunger Vital Sign    Worried About Running Out of Food in the Last Year: Never true    Ran Out of Food in the Last Year: Never true  Transportation Needs: No Transportation Needs (08/26/2022)   PRAPARE - Administrator, Civil Service (Medical): No    Lack of Transportation (Non-Medical): No  Physical Activity: Sufficiently Active (05/06/2023)   Exercise Vital Sign    Days of Exercise per Week: 5 days    Minutes of Exercise per Session: 50 min  Stress: No Stress Concern Present (08/26/2022)   [employer] of Occupational Health - Occupational Stress Questionnaire    Feeling of Stress : Only a little  Social Connections: Socially Integrated (08/26/2022)   Social Connection and Isolation Panel [NHANES]    Frequency of Communication with Friends and Family: More than three times a week    Frequency of Social Gatherings with Friends and Family:  Twice a week    Attends Religious Services: More than 4 times per year    Active Member of Clubs or Organizations: Yes    Attends Banker Meetings: 1 to 4 times per year    Marital Status: Married  Catering manager Violence: Not At Risk (08/26/2022)   Humiliation, Afraid, Rape, and Kick questionnaire    Fear of Current or Ex-Partner: No    Emotionally Abused: No    Physically Abused: No    Sexually Abused: No     Physical Exam   Vitals:   08/31/23 1611 08/31/23 2226  BP: 134/86 (!) 135/96  Pulse: (!) 106 (!) 112  Resp: 16 18  Temp: 99 F (37.2 C) 99.1 F (37.3 C)  SpO2: 96% 98%    CONSTITUTIONAL: Well-appearing, NAD NEURO/PSYCH:  Alert and oriented x 3, no  focal deficits EYES:  eyes equal and reactive ENT/NECK:  no LAD, no JVD CARDIO: Regular rate, well-perfused, normal S1 and S2 PULM:  CTAB no wheezing or rhonchi GI/GU:  non-distended, non-tender MSK/SPINE:  No gross deformities, unilateral edema to the left leg, neurovascularly intact SKIN:  no rash, atraumatic   *Additional and/or pertinent findings included in MDM below  Diagnostic and Interventional Summary    EKG Interpretation Date/Time:    Ventricular Rate:    PR Interval:    QRS Duration:    QT Interval:    QTC Calculation:   R Axis:      Text Interpretation:         Labs Reviewed  CBC WITH DIFFERENTIAL/PLATELET - Abnormal; Notable for the following components:      Result Value   WBC 13.1 (*)    Neutro Abs 10.4 (*)    All other components within normal limits  COMPREHENSIVE METABOLIC PANEL - Abnormal; Notable for the following components:   Total Protein 8.5 (*)    All other components within normal limits  APTT    No orders to display    Medications  apixaban (ELIQUIS) tablet 10 mg (has no administration in time range)     Procedures  /  Critical Care Procedures  ED Course and Medical Decision Making  Initial Impression and Ddx Leg pain and swelling on the left, apart from the edema the leg is well-appearing with a strong distal pulse, normal range of motion.  Patient says that the pain is improved compared to yesterday.  Ultrasound confirms DVT.  No chest pain or shortness of breath to suggest PE.  Past medical/surgical history that increases complexity of ED encounter: None  Interpretation of Diagnostics I personally reviewed the laboratory assessment and my interpretation is as follows: No significant blood count or electrolyte disturbance    Patient Reassessment and Ultimate Disposition/Management     Patient does not have significant bleeding history, not a significant fall risk, good candidate for discharge home on blood thinners,  encouraged close PCP follow-up, strict return precautions.  Patient management required discussion with the following services or consulting groups:  None  Complexity of Problems Addressed Acute illness or injury that poses threat of life of bodily function  Additional Data Reviewed and Analyzed Further history obtained from: Further history from spouse/family member  Additional Factors Impacting ED Encounter Risk Prescriptions  Elmer Sow. Pilar Plate, MD [facility] Emergency Medicine [facility] [email or web address]  Final Clinical Impressions(s) / ED Diagnoses     ICD-10-CM   1. Acute deep vein thrombosis (DVT) of femoral vein of left lower extremity (HCC)  I82.412  ED Discharge Orders          Ordered    APIXABAN (ELIQUIS) VTE STARTER PACK (10MG  AND 5MG )       Note to Pharmacy: If starter pack unavailable, substitute with seventy-four 5 mg apixaban tabs following the above SIG directions.   09/01/23 0030             Discharge Instructions Discussed with and Provided to Patient:    Discharge Instructions      You were evaluated in the Emergency Department and after careful evaluation, we did not find any emergent condition requiring admission or further testing in the hospital.  Your exam/testing today is overall reassuring.  Your testing shows that you have a blood clot (DVT) in your left leg.  Take the Eliquis blood thinner medication as directed and follow-up closely with your primary care doctor.  Avoid aspirin, avoid ibuprofen, avoid Aleve while taking this new Eliquis medication.  You can take Tylenol.  Avoid falls as we discussed.  Please return to the Emergency Department if you experience any worsening of your condition such as worsening pain or new chest pain or shortness of breath.   Thank you for allowing [place] to be a part of your care.      Sabas Sous, MD 09/01/23 [phone]

## 2023-09-01 NOTE — Discharge Instructions (Addendum)
You were evaluated in the Emergency Department and after careful evaluation, we did not find any emergent condition requiring admission or further testing in the hospital.  Your exam/testing today is overall reassuring.  Your testing shows that you have a blood clot (DVT) in your left leg.  Take the Eliquis blood thinner medication as directed and follow-up closely with your primary care doctor.  Avoid aspirin, avoid ibuprofen, avoid Aleve while taking this new Eliquis medication.  You can take Tylenol.  Avoid falls as we discussed.  Please return to the Emergency Department if you experience any worsening of your condition such as worsening pain or new chest pain or shortness of breath.   Thank you for allowing [place] to be a part of your care.

## 2023-09-01 NOTE — Telephone Encounter (Signed)
Rx sent 

## 2023-09-02 ENCOUNTER — Ambulatory Visit (INDEPENDENT_AMBULATORY_CARE_PROVIDER_SITE_OTHER): Payer: Medicare HMO | Admitting: Family Medicine

## 2023-09-02 ENCOUNTER — Encounter: Payer: Self-pay | Admitting: Family Medicine

## 2023-09-02 VITALS — BP 118/83 | HR 78 | Ht 64.5 in | Wt 159.1 lb

## 2023-09-02 DIAGNOSIS — I82412 Acute embolism and thrombosis of left femoral vein: Secondary | ICD-10-CM

## 2023-09-02 NOTE — Patient Instructions (Addendum)
I appreciate the opportunity to provide care to you today!    Follow up:  11/30/2023 months   While taking Eliquis (apixaban), it's important to be mindful of certain foods and beverages that can interact with the medication. Here are some things to consider avoiding:  Vitamin K-Rich Foods: Foods high in vitamin K, like leafy green vegetables (spinach, kale, broccoli), can affect blood clotting. It's not necessary to avoid them entirely, but try to keep your intake consistent.  Alcohol: Drinking alcohol can increase the risk of bleeding. It's best to limit alcohol consumption or discuss it with your doctor.  Grapefruit and Grapefruit Juice: These can interfere with the metabolism of many medications, including some blood thinners, although the interaction with Eliquis is less pronounced compared to others.  Herbal Supplements: Avoid supplements like St. John's wort, ginseng, and garlic, as they can affect blood clotting and interact with anticoagulants.   Please continue to a heart-healthy diet and increase your physical activities. Try to exercise for at least five days a week.    It was a pleasure to see you and I look forward to continuing to work together on your health and well-being. Please do not hesitate to call the office if you need care or have questions about your care.  In case of emergency, please visit the Emergency Department for urgent care, or contact our clinic at [phone] to schedule an appointment. We're here to help you!   Have a wonderful day and week. With Gratitude, Gilmore Laroche MSN, FNP-BC

## 2023-09-02 NOTE — Telephone Encounter (Signed)
Pt informed

## 2023-09-02 NOTE — Progress Notes (Unsigned)
Established Patient Office Visit  Subjective:  Patient ID: Carolyn Raymond, female    DOB: December 20, 1955  Age: 67 y.o. MRN: [id number]  CC:  Chief Complaint  Patient presents with   Care Management    Er F/u.     HPI AMANDY Raymond is Carolyn 67 y.o. female with past medical history of *** presents for f/u of *** chronic medical conditions.  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    Family History  Problem Relation Age of Onset   Pancreatic cancer Father     Social History   Socioeconomic History   Marital status: Married    Spouse name: Not on file   Number of children: Not on file   Years of education: Not on file   Highest education level: Not on file  Occupational History   Not on file  Tobacco Use   Smoking status: Never   Smokeless tobacco: Never  Vaping Use   Vaping status: Never Used  Substance and Sexual Activity   Alcohol use: Yes    Comment: occasionally   Drug use: No   Sexual activity: Yes    Birth control/protection: Post-menopausal  Other Topics Concern   Not on file  Social History Narrative   Not on file   Social Determinants of Health   Financial Resource Strain: Low Risk  (08/26/2022)   Overall Financial Resource Strain (CARDIA)    Difficulty of Paying Living Expenses: Not hard at all  Food Insecurity:  No Food Insecurity (08/26/2022)   Hunger Vital Sign    Worried About Running Out of Food in the Last Year: Never true    Ran Out of Food in the Last Year: Never true  Transportation Needs: No Transportation Needs (08/26/2022)   PRAPARE - Administrator, Civil Service (Medical): No    Lack of Transportation (Non-Medical): No  Physical Activity: Sufficiently Active (05/06/2023)   Exercise Vital Sign    Days of Exercise per Week: 5 days    Minutes of Exercise per Session: 50 min  Stress: No Stress Concern Present (08/26/2022)   [employer] of Occupational Health - Occupational Stress Questionnaire    Feeling of Stress : Only Carolyn little  Social Connections: Socially Integrated (08/26/2022)   Social Connection and Isolation Panel [NHANES]    Frequency of Communication with Friends and Family: More than three times Carolyn week    Frequency of Social Gatherings with Friends and Family: Twice Carolyn week    Attends Religious Services: More than 4 times per year    Active Member of [employer] or Organizations: Yes    Attends Banker Meetings: 1 to 4 times per year    Marital Status: Married  Catering manager Violence: Not At Risk (08/26/2022)   Humiliation, Afraid, Rape,  and Kick questionnaire    Fear of Current or Ex-Partner: No    Emotionally Abused: No    Physically Abused: No    Sexually Abused: No    Outpatient Medications Prior to Visit  Medication Sig Dispense Refill   APIXABAN (ELIQUIS) VTE STARTER PACK (10MG  AND 5MG ) Take as directed on package: start with two-5mg  tablets twice daily for 7 days. On day 8, switch to one-5mg  tablet twice daily. 74 each 0   Ascorbic Acid (VITAMIN C PO) Take by mouth daily.     CALCIUM PO Take 1,200 mg by mouth daily.     cetirizine (ZYRTEC ALLERGY) 10 MG tablet Take 1 tablet (10 mg total) by mouth daily. 30 tablet 0   Cholecalciferol (VITAMIN D-3) 25 MCG (1000 UT) CAPS Take 1,000 Units by mouth daily.     ezetimibe (ZETIA) 10 MG tablet  Take 1 tablet (10 mg total) by mouth daily. 90 tablet 3   Garlic 1000 MG CAPS Take 1,000 mg by mouth daily.     norethindrone (AYGESTIN) 5 MG tablet Take 1/2 tablet daily 90 tablet 4   ondansetron (ZOFRAN-ODT) 4 MG disintegrating tablet Take 1 tablet (4 mg total) by mouth every 8 (eight) hours as needed for nausea or vomiting. 20 tablet 0   rosuvastatin (CRESTOR) 20 MG tablet Take 1 tablet by mouth once daily 90 tablet 0   VITAMIN Carolyn PO Take 2,400 mg by mouth 4 (four) times Carolyn week.     hydrocortisone cream 0.5 % Apply 1 Application topically 2 (two) times daily. (Patient not taking: Reported on 09/02/2023) 30 g 0   hydrOXYzine (VISTARIL) 25 MG capsule Take 1 capsule (25 mg total) by mouth every 8 (eight) hours as needed. (Patient not taking: Reported on 09/02/2023) 30 capsule 0   No facility-administered medications prior to visit.    Allergies  Allergen Reactions   Bee Pollen Swelling    Can always tell when they are around   Isovue [Iopamidol] Itching and Swelling    ROS Review of Systems    Objective:    Physical Exam  BP 118/83   Pulse 78   Ht 5' 4.5" (1.638 m)   Wt 159 lb 1.3 oz (72.2 kg)   SpO2 98%   BMI 26.88 kg/m  Wt Readings from Last 3 Encounters:  09/02/23 159 lb 1.3 oz (72.2 kg)  08/31/23 158 lb (71.7 kg)  07/29/23 159 lb (72.1 kg)    Lab Results  Component Value Date   TSH 0.967 04/29/2023   Lab Results  Component Value Date   WBC 13.1 (H) 08/31/2023   HGB 13.7 08/31/2023   HCT 42.8 08/31/2023   MCV 94.7 08/31/2023   PLT 220 08/31/2023   Lab Results  Component Value Date   NA 137 08/31/2023   K 4.0 08/31/2023   CO2 26 08/31/2023   GLUCOSE 91 08/31/2023   BUN 8 08/31/2023   CREATININE 0.72 08/31/2023   BILITOT 1.0 08/31/2023   ALKPHOS 81 08/31/2023   AST 15 08/31/2023   ALT 16 08/31/2023   PROT 8.5 (H) 08/31/2023   ALBUMIN 3.8 08/31/2023   CALCIUM 9.2 08/31/2023   ANIONGAP 10 08/31/2023   EGFR 69 04/29/2023   Lab Results  Component  Value Date   CHOL 133 04/29/2023   Lab Results  Component Value Date   HDL 47 04/29/2023   Lab Results  Component Value Date   LDLCALC 74 04/29/2023   Lab Results  Component Value Date  TRIG 55 04/29/2023   Lab Results  Component Value Date   CHOLHDL 2.8 04/29/2023   Lab Results  Component Value Date   HGBA1C 6.0 (H) 04/29/2023      Assessment & Plan:  There are no diagnoses linked to this encounter.  Follow-up: No follow-ups on file.   Gilmore Laroche, FNP

## 2023-09-03 DIAGNOSIS — I82409 Acute embolism and thrombosis of unspecified deep veins of unspecified lower extremity: Secondary | ICD-10-CM | POA: Insufficient documentation

## 2023-09-03 NOTE — Assessment & Plan Note (Addendum)
Encouraged to continue on therapy Encouraged rest, elevation, and ambulation Advised to report symptoms of chest tightness, chest pain, cough, shortness of breath suggestive of pulmonary embolism Advised to avoid vitamin K rich foods, alcohol, and grapefruit juice while taking Eliquis Will be treated for at least 3 months

## 2023-09-24 ENCOUNTER — Telehealth: Payer: Self-pay | Admitting: Family Medicine

## 2023-09-24 NOTE — Telephone Encounter (Signed)
Came into office she has started the Eliquis starter pack and almost complete , need refill on ELIQUIS   How will patient know when the clot has dissolved?  Leg still huge. Patient call # [phone] or [phone]  Pharmacy: [facility]

## 2023-09-25 ENCOUNTER — Telehealth: Payer: Self-pay | Admitting: Family Medicine

## 2023-09-25 ENCOUNTER — Other Ambulatory Visit: Payer: Self-pay | Admitting: Family Medicine

## 2023-09-25 DIAGNOSIS — I82412 Acute embolism and thrombosis of left femoral vein: Secondary | ICD-10-CM

## 2023-09-25 MED ORDER — APIXABAN 5 MG PO TABS: 5.0000 mg | ORAL_TABLET | Freq: Two times a day (BID) | ORAL | 1 refills | Status: DC

## 2023-09-25 NOTE — Telephone Encounter (Signed)
Please inform the patient prescription for Eliquis 5 mg twice daily has been sent to her pharmacy

## 2023-09-25 NOTE — Telephone Encounter (Signed)
Copied from CRM [id number]. Topic: General - Other >> Sep 23, 2023  1:44 PM Eunice Blase wrote: Reason for CRM: Pt called [phone] or [phone] needs medication refill [facility].

## 2023-09-25 NOTE — Telephone Encounter (Signed)
Wants information on which foods to avoid states ER mentioned abnormal vitamin K levels.

## 2023-09-26 NOTE — Telephone Encounter (Signed)
Please advise to avoid Vit K rich foods while on eliquis Vitamin K-Rich Foods: Foods high in vitamin K, like leafy green vegetables (spinach, kale, broccoli), can affect blood clotting. It's not necessary to avoid them entirely, but try to keep your intake consistent.  Alcohol: Drinking alcohol can increase the risk of bleeding. It's best to limit alcohol consumption or discuss it with your doctor.  Grapefruit and Grapefruit Juice: These can interfere with the metabolism of many medications, including some blood thinners, although the interaction with Eliquis is less pronounced compared to others.  Herbal Supplements: Avoid supplements like St. John's wort, ginseng, and garlic, as they can affect blood clotting and interact with anticoagulants.

## 2023-09-28 NOTE — Telephone Encounter (Signed)
Letter mailed out to patient with reccomendations.

## 2023-10-13 ENCOUNTER — Other Ambulatory Visit: Payer: Self-pay

## 2023-10-13 ENCOUNTER — Telehealth: Payer: Self-pay

## 2023-10-13 DIAGNOSIS — E7849 Other hyperlipidemia: Secondary | ICD-10-CM

## 2023-10-13 MED ORDER — ROSUVASTATIN CALCIUM 20 MG PO TABS
20.0000 mg | ORAL_TABLET | Freq: Every day | ORAL | 0 refills | Status: DC
Start: 2023-10-13 — End: 2024-01-27

## 2023-10-13 NOTE — Telephone Encounter (Signed)
Copied from CRM [id number]. Topic: Clinical - Medication Refill >> Oct 12, 2023  1:29 PM Tiffany H wrote: Most Recent Primary Care Visit:  Provider: Gilmore Laroche  Department: [facility]  Visit Type: OFFICE VISIT  Date: 09/02/2023  Medication: rosuvastatin (CRESTOR) 20 MG tablet  Has the patient contacted their pharmacy? Yes (Agent: If no, request that the patient contact the pharmacy for the refill. If patient does not wish to contact the pharmacy document the reason why and proceed with request.) (Agent: If yes, when and what did the pharmacy advise?)  Pharmacy sent patient to clinic.  Is this the correct pharmacy for this prescription? Yes If no, delete pharmacy and type the correct one.  This is the patient's preferred pharmacy:  [facility] [address] [place] - [address] [place] [address] [place] [address] Phone: [phone] Fax: [phone]   Has the prescription been filled recently? Yes  Is the patient out of the medication? No  Has the patient been seen for an appointment in the last year OR does the patient have an upcoming appointment? Yes  Can we respond through MyChart? No  Agent: Please be advised that Rx refills may take up to 3 business days. We ask that you follow-up with your pharmacy.

## 2023-10-13 NOTE — Telephone Encounter (Signed)
Refill sent.

## 2023-11-24 ENCOUNTER — Telehealth: Payer: Self-pay

## 2023-11-24 ENCOUNTER — Other Ambulatory Visit: Payer: Self-pay

## 2023-11-24 DIAGNOSIS — I82412 Acute embolism and thrombosis of left femoral vein: Secondary | ICD-10-CM

## 2023-11-24 MED ORDER — APIXABAN 5 MG PO TABS
5.0000 mg | ORAL_TABLET | Freq: Two times a day (BID) | ORAL | 1 refills | Status: DC
Start: 2023-11-24 — End: 2024-02-17

## 2023-11-24 NOTE — Telephone Encounter (Signed)
 Rx sent

## 2023-11-24 NOTE — Telephone Encounter (Signed)
 Copied from CRM [id number]. Topic: Clinical - Medication Refill >> Nov 24, 2023  2:38 PM Susanna ORN wrote: Most Recent Primary Care Visit:  Provider: MAREN DAS  Department: [facility]  Visit Type: OFFICE VISIT  Date: 09/02/2023  Medication: apixaban  (ELIQUIS ) 5 MG TABS tablet  Has the patient contacted their pharmacy? No, no refills left (Agent: If no, request that the patient contact the pharmacy for the refill. If patient does not wish to contact the pharmacy document the reason why and proceed with request.) (Agent: If yes, when and what did the pharmacy advise?)  Is this the correct pharmacy for this prescription? Yes If no, delete pharmacy and type the correct one.  This is the patient's preferred pharmacy:  [facility] [address], [place] - [address] [place] [address] [place] [address] Phone: [phone] Fax: [phone]   Has the prescription been filled recently? Yes  Is the patient out of the medication? Yes  Has the patient been seen for an appointment in the last year OR does the patient have an upcoming appointment? Yes  Can we respond through MyChart? No  Agent: Please be advised that Rx refills may take up to 3 business days. We ask that you follow-up with your pharmacy.

## 2023-11-30 ENCOUNTER — Encounter: Payer: Self-pay | Admitting: Family Medicine

## 2023-11-30 ENCOUNTER — Ambulatory Visit (INDEPENDENT_AMBULATORY_CARE_PROVIDER_SITE_OTHER): Payer: Medicare HMO | Admitting: Family Medicine

## 2023-11-30 VITALS — BP 120/77 | HR 83 | Ht 64.5 in | Wt 153.1 lb

## 2023-11-30 DIAGNOSIS — I82409 Acute embolism and thrombosis of unspecified deep veins of unspecified lower extremity: Secondary | ICD-10-CM

## 2023-11-30 DIAGNOSIS — E785 Hyperlipidemia, unspecified: Secondary | ICD-10-CM | POA: Insufficient documentation

## 2023-11-30 DIAGNOSIS — E049 Nontoxic goiter, unspecified: Secondary | ICD-10-CM | POA: Insufficient documentation

## 2023-11-30 DIAGNOSIS — E559 Vitamin D deficiency, unspecified: Secondary | ICD-10-CM | POA: Insufficient documentation

## 2023-11-30 DIAGNOSIS — E7849 Other hyperlipidemia: Secondary | ICD-10-CM

## 2023-11-30 NOTE — Telephone Encounter (Signed)
 Copied from CRM [id number]. Topic: Clinical - Medication Question >> Nov 30, 2023  2:54 PM Carmell SAUNDERS wrote: Reason for CRM: Pt asking if she can go ahead and start taking her Aspirin  81mg  tonight, instead of taking the apixaban  (ELIQUIS ) 5 MG TABS tablets for the next 3 days. Please contact patient back asap. [phone] She also had some additional questions about the leftovers of Eliquis  she may have.

## 2023-11-30 NOTE — Progress Notes (Signed)
 Established Patient Office Visit  Subjective:  Patient ID: CHRISOULA ZEGARRA, female    DOB: [birth year]  Age: 68 y.o. MRN: [id number]  CC:  Chief Complaint  Patient presents with   Follow-up    4 mo  Concerns about blood clot, how long she will need to be on blood thinner, and that she has had some inconsistent blood in stool    HPI ZELPHIA GLOVER is a 68 y.o. female with past medical history of DVT and hyperlipidemia presents for f/u of  chronic medical conditions. For the details of today's visit, please refer to the assessment and plan.   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    Family History  Problem Relation Age of Onset   Pancreatic cancer Father     Social History   Socioeconomic History   Marital status: Married    Spouse name: Not on file   Number of children: Not on file   Years of education: Not on file   Highest education level: Not on file  Occupational History   Not on file  Tobacco Use   Smoking status: Never   Smokeless tobacco: Never  Vaping Use   Vaping status: Never Used  Substance and Sexual Activity   Alcohol use: Yes    Comment: occasionally   Drug use: No   Sexual activity: Yes    Birth control/protection: Post-menopausal  Other Topics Concern   Not on file  Social History Narrative   Not on  file   Social Drivers of Health   Financial Resource Strain: Low Risk  (08/26/2022)   Overall Financial Resource Strain (CARDIA)    Difficulty of Paying Living Expenses: Not hard at all  Food Insecurity: No Food Insecurity (08/26/2022)   Hunger Vital Sign    Worried About Running Out of Food in the Last Year: Never true    Ran Out of Food in the Last Year: Never true  Transportation Needs: No Transportation Needs (08/26/2022)   PRAPARE - Administrator, Civil Service (Medical): No    Lack of Transportation (Non-Medical): No  Physical Activity: Sufficiently Active (05/06/2023)   Exercise Vital Sign    Days of Exercise per Week: 5 days    Minutes of Exercise per Session: 50 min  Stress: No Stress Concern Present (08/26/2022)   [employer] of Occupational Health - Occupational Stress Questionnaire    Feeling of Stress : Only a little  Social Connections: Socially Integrated (08/26/2022)   Social Connection and Isolation Panel [NHANES]    Frequency of Communication with Friends and Family: More than three times a week    Frequency of Social Gatherings with Friends and Family: Twice a week    Attends Religious Services: More than 4 times per year    Active  Member of Clubs or Organizations: Yes    Attends Banker Meetings: 1 to 4 times per year    Marital Status: Married  Catering Manager Violence: Not At Risk (08/26/2022)   Humiliation, Afraid, Rape, and Kick questionnaire    Fear of Current or Ex-Partner: No    Emotionally Abused: No    Physically Abused: No    Sexually Abused: No    Outpatient Medications Prior to Visit  Medication Sig Dispense Refill   apixaban  (ELIQUIS ) 5 MG TABS tablet Take 1 tablet (5 mg total) by mouth 2 (two) times daily. 60 tablet 1   Ascorbic Acid (VITAMIN C PO) Take by mouth daily.     CALCIUM  PO Take 1,200 mg by mouth daily.     Cholecalciferol (VITAMIN D -3) 25 MCG (1000 UT) CAPS Take 1,000 Units by mouth daily.      ezetimibe  (ZETIA ) 10 MG tablet Take 1 tablet (10 mg total) by mouth daily. 90 tablet 3   rosuvastatin  (CRESTOR ) 20 MG tablet Take 1 tablet (20 mg total) by mouth daily. 90 tablet 0   cetirizine  (ZYRTEC  ALLERGY) 10 MG tablet Take 1 tablet (10 mg total) by mouth daily. (Patient not taking: Reported on 11/30/2023) 30 tablet 0   Garlic 1000 MG CAPS Take 1,000 mg by mouth daily. (Patient not taking: Reported on 11/30/2023)     hydrocortisone  cream 0.5 % Apply 1 Application topically 2 (two) times daily. (Patient not taking: Reported on 09/02/2023) 30 g 0   hydrOXYzine  (VISTARIL ) 25 MG capsule Take 1 capsule (25 mg total) by mouth every 8 (eight) hours as needed. (Patient not taking: Reported on 09/02/2023) 30 capsule 0   norethindrone  (AYGESTIN ) 5 MG tablet Take 1/2 tablet daily (Patient not taking: Reported on 11/30/2023) 90 tablet 4   ondansetron  (ZOFRAN -ODT) 4 MG disintegrating tablet Take 1 tablet (4 mg total) by mouth every 8 (eight) hours as needed for nausea or vomiting. (Patient not taking: Reported on 11/30/2023) 20 tablet 0   VITAMIN A PO Take 2,400 mg by mouth 4 (four) times a week. (Patient not taking: Reported on 11/30/2023)     No facility-administered medications prior to visit.    Allergies  Allergen Reactions   Bee Pollen Swelling    Can always tell when they are around   Isovue [Iopamidol] Itching and Swelling    ROS Review of Systems  Constitutional:  Negative for chills and fever.  Eyes:  Negative for visual disturbance.  Respiratory:  Negative for chest tightness and shortness of breath.   Neurological:  Negative for dizziness and headaches.      Objective:    Physical Exam HENT:     Head: Normocephalic.     Mouth/Throat:     Mouth: Mucous membranes are moist.  Cardiovascular:     Rate and Rhythm: Normal rate.     Heart sounds: Normal heart sounds.  Pulmonary:     Effort: Pulmonary effort is normal.     Breath sounds: Normal breath sounds.  Neurological:      Mental Status: She is alert.     BP 120/77   Pulse 83   Ht 5' 4.5 (1.638 m)   Wt 153 lb 1.9 oz (69.5 kg)   SpO2 96%   BMI 25.88 kg/m  Wt Readings from Last 3 Encounters:  11/30/23 153 lb 1.9 oz (69.5 kg)  09/02/23 159 lb 1.3 oz (72.2 kg)  08/31/23 158 lb (71.7 kg)    Lab Results  Component Value Date  TSH 0.967 04/29/2023   Lab Results  Component Value Date   WBC 13.1 (H) 08/31/2023   HGB 13.7 08/31/2023   HCT 42.8 08/31/2023   MCV 94.7 08/31/2023   PLT 220 08/31/2023   Lab Results  Component Value Date   NA 137 08/31/2023   K 4.0 08/31/2023   CO2 26 08/31/2023   GLUCOSE 91 08/31/2023   BUN 8 08/31/2023   CREATININE 0.72 08/31/2023   BILITOT 1.0 08/31/2023   ALKPHOS 81 08/31/2023   AST 15 08/31/2023   ALT 16 08/31/2023   PROT 8.5 (H) 08/31/2023   ALBUMIN 3.8 08/31/2023   CALCIUM  9.2 08/31/2023   ANIONGAP 10 08/31/2023   EGFR 69 04/29/2023   Lab Results  Component Value Date   CHOL 133 04/29/2023   Lab Results  Component Value Date   HDL 47 04/29/2023   Lab Results  Component Value Date   LDLCALC 74 04/29/2023   Lab Results  Component Value Date   TRIG 55 04/29/2023   Lab Results  Component Value Date   CHOLHDL 2.8 04/29/2023   Lab Results  Component Value Date   HGBA1C 6.0 (H) 04/29/2023      Assessment & Plan:  Acute deep vein thrombosis (DVT) of lower extremity, unspecified laterality, unspecified vein (HCC) Assessment & Plan: I encouraged you to stop taking Eliquis  on 12/03/2023. I recommend taking baby aspirin  81 mg daily or every other day after stopping Eliquis . The occasional blood noticed is likely due to being on Eliquis . Please monitor for blood in your stools after stopping Eliquis , and further workup will be done if necessary. I reviewed preventive measures for DVT, including staying active, using compression stockings, staying well-hydrated, maintaining a healthy weight to reduce the risk, and avoiding prolonged  immobilization. I encouraged you to monitor for signs of DVT, including swelling, pain, redness, or warmth in one leg.    Other hyperlipidemia Assessment & Plan: She take rosuvastatin  20 mg daily and is ezetimibe  10 mg daily The patient was encouraged to make lifestyle changes, including avoiding simple carbohydrates such as cakes, sweet desserts, ice cream, soda (diet or regular), sweet tea, candies, chips, cookies, store-bought juices, excessive alcohol (more than 1-2 drinks per day), lemonade, artificial sweeteners, donuts, coffee creamers, and sugar-free products. Additionally, reducing the consumption of greasy, fatty foods and increasing physical activity were recommended. The patient verbalized understanding and is aware of the plan of care.  Lab Results  Component Value Date   CHOL 133 04/29/2023   HDL 47 04/29/2023   LDLCALC 74 04/29/2023   TRIG 55 04/29/2023   CHOLHDL 2.8 04/29/2023      Note: This chart has been completed using [employer] software, and while attempts have been made to ensure accuracy, certain words and phrases may not be transcribed as intended.    Follow-up: Return in about 4 months (around 03/29/2024).   Marsel Gail, FNP

## 2023-11-30 NOTE — Patient Instructions (Addendum)
 I appreciate the opportunity to provide care to you today!    Follow up:  4 months  Labs: please stop by the lab today to get your blood drawn (CBC, CMP, TSH, Lipid profile, HgA1c, Vit D)  You may stop taking Eliquis  on 12/03/2023. I recommend that when you stop Eliquis , you take baby aspirin  81 mg daily or every other day. The occasional blood noticed is likely due to being on Eliquis . Please monitor for blood in your stools after stopping Eliquis , and further workup will be done if necessary.  Preventing deep vein thrombosis (DVT) is important, especially for individuals at higher risk, such as those who have had recent surgery, are immobile for long periods, or have certain medical conditions. Here are several strategies to help prevent DVT:  1. Stay Active Move regularly: If you're able to, try to walk or change positions every few hours, especially during long periods of sitting or lying down (e.g., long flights, long car rides, or bed rest). Leg exercises: While sitting or lying down, flex your ankles, feet, and legs regularly to improve blood flow (e.g., foot circles, ankle pumps). 2. Compression Stockings Graduated compression stockings: These stockings apply pressure to the legs, helping to promote blood circulation and reduce the risk of blood clots, particularly during long periods of immobility (e.g., after surgery or during long travel). Follow recommendations: Your healthcare provider may recommend specific compression stockings, particularly if you are at high risk for DVT. 3. Hydration Stay well-hydrated: Dehydration can increase the risk of clotting, so it's important to drink enough fluids, particularly during long flights or travel. 4. Weight Management Maintain a healthy weight: Obesity increases the risk of DVT due to added pressure on veins and circulation issues. A healthy weight can help reduce this risk. 5. Avoid Prolonged Immobilization Get up and move: During long  periods of sitting (such as during long flights or car rides), try to stand up and move around or perform seated leg exercises every 1 to 2 hours. 6. Elevate Your Legs Leg elevation: If you're at risk for DVT or experience swelling in your legs, elevating your legs when resting can help promote circulation. 7. Monitor for Signs of DVT Recognize symptoms: If you experience swelling, pain, redness, or warmth in one leg (especially the calf), it's important to seek medical attention quickly, as these could be signs of DVT.    Please continue to a heart-healthy diet and increase your physical activities. Try to exercise for at least five days a week.    It was a pleasure to see you and I look forward to continuing to work together on your health and well-being. Please do not hesitate to call the office if you need care or have questions about your care.  In case of emergency, please visit the Emergency Department for urgent care, or contact our clinic at [phone] to schedule an appointment. We're here to help you!   Have a wonderful day and week. With Gratitude, Kaden Dunkel MSN, FNP-BC

## 2023-11-30 NOTE — Assessment & Plan Note (Signed)
 I encouraged you to stop taking Eliquis  on 12/03/2023. I recommend taking baby aspirin  81 mg daily or every other day after stopping Eliquis . The occasional blood noticed is likely due to being on Eliquis . Please monitor for blood in your stools after stopping Eliquis , and further workup will be done if necessary. I reviewed preventive measures for DVT, including staying active, using compression stockings, staying well-hydrated, maintaining a healthy weight to reduce the risk, and avoiding prolonged immobilization. I encouraged you to monitor for signs of DVT, including swelling, pain, redness, or warmth in one leg.

## 2023-11-30 NOTE — Assessment & Plan Note (Signed)
 She take rosuvastatin  20 mg daily and is ezetimibe  10 mg daily The patient was encouraged to make lifestyle changes, including avoiding simple carbohydrates such as cakes, sweet desserts, ice cream, soda (diet or regular), sweet tea, candies, chips, cookies, store-bought juices, excessive alcohol (more than 1-2 drinks per day), lemonade, artificial sweeteners, donuts, coffee creamers, and sugar-free products. Additionally, reducing the consumption of greasy, fatty foods and increasing physical activity were recommended. The patient verbalized understanding and is aware of the plan of care.  Lab Results  Component Value Date   CHOL 133 04/29/2023   HDL 47 04/29/2023   LDLCALC 74 04/29/2023   TRIG 55 04/29/2023   CHOLHDL 2.8 04/29/2023

## 2023-11-30 NOTE — Telephone Encounter (Signed)
 I recommend adhering to the recommendations discussed.

## 2023-12-01 LAB — CBC WITH DIFFERENTIAL/PLATELET
Basophils Absolute: 0.1 10*3/uL (ref 0.0–0.2)
Basos: 1 %
EOS (ABSOLUTE): 0.2 10*3/uL (ref 0.0–0.4)
Eos: 3 %
Hematocrit: 42.7 % (ref 34.0–46.6)
Hemoglobin: 14 g/dL (ref 11.1–15.9)
Immature Grans (Abs): 0 10*3/uL (ref 0.0–0.1)
Immature Granulocytes: 0 %
Lymphocytes Absolute: 2.2 10*3/uL (ref 0.7–3.1)
Lymphs: 32 %
MCH: 31 pg (ref 26.6–33.0)
MCHC: 32.8 g/dL (ref 31.5–35.7)
MCV: 95 fL (ref 79–97)
Monocytes Absolute: 0.5 10*3/uL (ref 0.1–0.9)
Monocytes: 7 %
Neutrophils Absolute: 4 10*3/uL (ref 1.4–7.0)
Neutrophils: 57 %
Platelets: 288 10*3/uL (ref 150–450)
RBC: 4.52 x10E6/uL (ref 3.77–5.28)
RDW: 12.4 % (ref 11.7–15.4)
WBC: 6.9 10*3/uL (ref 3.4–10.8)

## 2023-12-01 LAB — LIPID PANEL
Chol/HDL Ratio: 2.4 {ratio} (ref 0.0–4.4)
Cholesterol, Total: 146 mg/dL (ref 100–199)
HDL: 60 mg/dL (ref >39)
LDL Chol Calc (NIH): 74 mg/dL (ref 0–99)
Triglycerides: 54 mg/dL (ref 0–149)
VLDL Cholesterol Cal: 12 mg/dL (ref 5–40)

## 2023-12-01 LAB — CMP14+EGFR
ALT: 16 [IU]/L (ref 0–32)
AST: 17 [IU]/L (ref 0–40)
Albumin: 4.4 g/dL (ref 3.9–4.9)
Alkaline Phosphatase: 110 [IU]/L (ref 44–121)
BUN/Creatinine Ratio: 16 (ref 12–28)
BUN: 14 mg/dL (ref 8–27)
Bilirubin Total: 0.6 mg/dL (ref 0.0–1.2)
CO2: 25 mmol/L (ref 20–29)
Calcium: 9.8 mg/dL (ref 8.7–10.3)
Chloride: 101 mmol/L (ref 96–106)
Creatinine, Ser: 0.88 mg/dL (ref 0.57–1.00)
Globulin, Total: 3.2 g/dL (ref 1.5–4.5)
Glucose: 93 mg/dL (ref 70–99)
Potassium: 4.8 mmol/L (ref 3.5–5.2)
Sodium: 140 mmol/L (ref 134–144)
Total Protein: 7.6 g/dL (ref 6.0–8.5)
eGFR: 72 mL/min/{1.73_m2} (ref >59)

## 2023-12-01 LAB — HEMOGLOBIN A1C
Est. average glucose Bld gHb Est-mCnc: 131 mg/dL
Hgb A1c MFr Bld: 6.2 % — ABNORMAL HIGH (ref 4.8–5.6)

## 2023-12-01 LAB — TSH+FREE T4
Free T4: 0.96 ng/dL (ref 0.82–1.77)
TSH: 1.01 u[IU]/mL (ref 0.450–4.500)

## 2023-12-01 LAB — VITAMIN D 25 HYDROXY (VIT D DEFICIENCY, FRACTURES): Vit D, 25-Hydroxy: 46.6 ng/mL (ref 30.0–100.0)

## 2023-12-02 ENCOUNTER — Encounter: Payer: Self-pay | Admitting: Family Medicine

## 2023-12-03 ENCOUNTER — Other Ambulatory Visit: Payer: Self-pay | Admitting: Family Medicine

## 2023-12-03 DIAGNOSIS — E7849 Other hyperlipidemia: Secondary | ICD-10-CM

## 2023-12-03 MED ORDER — EZETIMIBE 10 MG PO TABS
10.0000 mg | ORAL_TABLET | Freq: Every day | ORAL | 3 refills | Status: AC
Start: 2023-12-03 — End: ?

## 2023-12-03 NOTE — Telephone Encounter (Signed)
Copied from CRM [id number]. Topic: Clinical - Medication Refill >> Dec 03, 2023  2:22 PM Eunice Blase wrote: Most Recent Primary Care Visit:  Provider: Gilmore Laroche  Department: [facility]  Visit Type: OFFICE VISIT  Date: 11/30/2023  Medication: ***  Has the patient contacted their pharmacy?  (Agent: If no, request that the patient contact the pharmacy for the refill. If patient does not wish to contact the pharmacy document the reason why and proceed with request.) (Agent: If yes, when and what did the pharmacy advise?)  Is this the correct pharmacy for this prescription?  If no, delete pharmacy and type the correct one.  This is the patient's preferred pharmacy:  [facility] [address] [place] - [address] [place] [address] Phone: [phone] Fax: [phone]  [id number] [place], [place] - [address] AT [facility] [address]  [place] [address] Phone: [phone] Fax: [phone]   Has the prescription been filled recently?   Is the patient out of the medication?   Has the patient been seen for an appointment in the last year OR does the patient have an upcoming appointment?   Can we respond through MyChart?   Agent: Please be advised that Rx refills may take up to 3 business days. We ask that you follow-up with your pharmacy.

## 2024-01-25 ENCOUNTER — Telehealth: Payer: Self-pay

## 2024-01-25 NOTE — Telephone Encounter (Signed)
 Copied from CRM [id number]. Topic: Referral - Question >> Jan 22, 2024  3:48 PM Abundio Miu S wrote: Reason for CRM: Patient called in requesting a referral to [facility] . Fax # [phone]

## 2024-01-27 ENCOUNTER — Other Ambulatory Visit: Payer: Self-pay | Admitting: Family Medicine

## 2024-01-27 DIAGNOSIS — E7849 Other hyperlipidemia: Secondary | ICD-10-CM

## 2024-01-27 NOTE — Telephone Encounter (Signed)
 Last Fill: 10/13/23  Last OV: 11/30/23 Next OV: 05/10/24 AWV  Routing to provider for review/authorization.

## 2024-01-27 NOTE — Telephone Encounter (Signed)
 Copied from CRM [id number]. Topic: Clinical - Medication Refill >> Jan 27, 2024  4:55 PM Fuller Mandril wrote: Most Recent Primary Care Visit:  Provider: Gilmore Laroche  Department: [facility]  Visit Type: OFFICE VISIT  Date: 11/30/2023  Medication: rosuvastatin (CRESTOR) 20 MG tablet  Has the patient contacted their pharmacy? Yes (Agent: If no, request that the patient contact the pharmacy for the refill. If patient does not wish to contact the pharmacy document the reason why and proceed with request.) (Agent: If yes, when and what did the pharmacy advise?)provider needs to authorize  Is this the correct pharmacy for this prescription? Yes If no, delete pharmacy and type the correct one.  This is the patient's preferred pharmacy:  [facility] [address] [place] - [address] [place] [address] [place] [address] Phone: [phone] Fax: [phone]    Has the prescription been filled recently? No  Is the patient out of the medication? Yes  Has the patient been seen for an appointment in the last year OR does the patient have an upcoming appointment? Yes  Can we respond through MyChart? No  Agent: Please be advised that Rx refills may take up to 3 business days. We ask that you follow-up with your pharmacy.

## 2024-01-28 MED ORDER — ROSUVASTATIN CALCIUM 20 MG PO TABS
20.0000 mg | ORAL_TABLET | Freq: Every day | ORAL | 0 refills | Status: DC
Start: 2024-01-28 — End: 2024-05-09

## 2024-02-01 ENCOUNTER — Other Ambulatory Visit: Payer: Self-pay

## 2024-02-01 DIAGNOSIS — I82409 Acute embolism and thrombosis of unspecified deep veins of unspecified lower extremity: Secondary | ICD-10-CM

## 2024-02-01 NOTE — Telephone Encounter (Signed)
 Referral has been placed.

## 2024-02-09 ENCOUNTER — Ambulatory Visit
Admission: RE | Admit: 2024-02-09 | Discharge: 2024-02-09 | Disposition: A | Discharge status: Home / Self Care | Source: Ambulatory Visit | Attending: Family Medicine | Admitting: Family Medicine

## 2024-02-09 ENCOUNTER — Ambulatory Visit: Payer: Self-pay

## 2024-02-09 VITALS — BP 143/92 | HR 102 | Temp 98.3°F | Resp 18

## 2024-02-09 DIAGNOSIS — Z86718 Personal history of other venous thrombosis and embolism: Secondary | ICD-10-CM

## 2024-02-09 DIAGNOSIS — Z8679 Personal history of other diseases of the circulatory system: Secondary | ICD-10-CM

## 2024-02-09 DIAGNOSIS — I8392 Asymptomatic varicose veins of left lower extremity: Secondary | ICD-10-CM

## 2024-02-09 NOTE — Discharge Instructions (Signed)
 Begin wearing her TED hose daily.  Wear them for at least 12 hours during the day, remove at night, repeat while symptoms persist. Elevate the lower extremity above the level of the heart is much as possible while symptoms persist. Make sure you are wearing shoes with good insole and support. May take over-the-counter Tylenol as needed for pain or discomfort. Go to the emergency department immediately if you experience chest pain, shortness of breath, difficulty breathing, pain or swelling in the left lower extremity or calf. Follow-up with your primary care physician for further evaluation or with the vascular clinic. Follow-up as needed.

## 2024-02-09 NOTE — ED Triage Notes (Signed)
 Pt reports she has some discoloration and stinging sensation in her left inner ankle (cannot provider time frame)    Does not take eliquis any more per provider. Aspirin only

## 2024-02-09 NOTE — Telephone Encounter (Signed)
 Chief Complaint: Left Ankle Swelling Symptoms: discoloration Frequency: Pt unsure when it began Pertinent Negatives: Patient denies CP, SOB, redness, warmth Disposition: [] ED /[x] Urgent Care (no appt availability in office) / [] Appointment(In office/virtual)/ []  [facility]/ [] Home Care/ [] Refused Recommended Disposition /[] [facility]/ []  Follow-up with PCP Additional Notes: Pt reports she is currently being treated for DVT to left leg, she notes swelling has begun to resolve but she remains with swelling and new discoloration to her left foot. Pt denies CP, SOB, redness, warmth. Due to pt's current treatment for DVT, OV advised today. OV unavailable, UC visit scheduled at [facility]. This RN educated pt on home care, new-worsening symptoms, when to call back/seek emergent care. Pt verbalized understanding and agrees to plan.    Copied from CRM [id number]. Topic: Clinical - Red Word Triage >> Feb 09, 2024  2:32 PM Abundio Miu S wrote: [facility] that prompted transfer to Nurse Triage: swelling and bruising near LT ankle, history of blood clots. Reason for Disposition  MILD or MODERATE ankle swelling (e.g., can't move joint normally, can't do usual activities) (Exceptions: Itchy, localized swelling; swelling is chronic.)  Answer Assessment - Initial Assessment Questions 1. LOCATION: "Which ankle is swollen?" "Where is the swelling?"     Left 2. ONSET: "When did the swelling start?"     Pt cannot recall 3. SWELLING: "How bad is the swelling?" Or, "How large is it?" (e.g., mild, moderate, severe; size of localized swelling)    - NONE: No joint swelling.   - LOCALIZED: Localized; small area of puffy or swollen skin (e.g., insect bite, skin irritation).   - MILD: Joint looks or feels mildly swollen or puffy.   - MODERATE: Swollen; interferes with normal activities (e.g., work or school); decreased range of movement; may be limping.   - SEVERE: Very swollen; can't move swollen  joint at all; limping a lot or unable to walk.     Mild, puffiness 4. PAIN: "Is there any pain?" If Yes, ask: "How bad is it?" (Scale 1-10; or mild, moderate, severe)   - NONE (0): no pain.   - MILD (1-3): doesn't interfere with normal activities.    - MODERATE (4-7): interferes with normal activities (e.g., work or school) or awakens from sleep, limping.    - SEVERE (8-10): excruciating pain, unable to do any normal activities, unable to walk.      Left inside of ankle, 2/10 5. CAUSE: "What do you think caused the ankle swelling?"     Unknown 6. OTHER SYMPTOMS: "Do you have any other symptoms?" (e.g., fever, chest pain, difficulty breathing, calf pain)     Discoloration on left top of ankle/foot  Protocols used: Ankle Swelling-A-AH

## 2024-02-09 NOTE — ED Provider Notes (Signed)
 [facility]    CSN: [id number] Arrival date & time: 02/09/24  1756      History   Chief Complaint Chief Complaint  Patient presents with   Leg Swelling    Ankle swelling, currently being treated for DVT - Entered by patient    HPI Carolyn Raymond is a 68 y.o. female.   The history is provided by the patient.   Patient presents for complaints of bruising to the left ankle along with an occasional stinging sensation in her foot.  Patient states symptoms have been present for the past several months.  Patient states she has a history of DVT and was taking Eliquis.  States in January, she dropped her cell phone on her foot, and also hit her ankle.  She states she was on blood thinners at the time.  She states she has since developed bruising with intermittent swelling to the left ankle.  Patient states that the bruising has since improved, but she is concerned because she continues to experience the bruising with discoloration to the left ankle.  Patient denies fever, chills, ankle pain, shortness of breath, difficulty breathing, chest pain, calf pain or swelling.  Patient states that she spoke with the vascular clinic that she has seen in the past for varicose veins, and they advised that they would most likely start her on blood thinners again.  Patient states that she has also spoken to her PCP about her current symptoms, but states the PCP did not look at her ankle during that appointment.  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   Vitamin D deficiency 07/11/2013   Abdominal pain, chronic, right lower quadrant 07/05/2013   Routine general medical examination at a health care facility 07/04/2013   FATIGUE 01/29/2010   GOITER, NONTOXIC MULTINODULAR 05/30/2008   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/30/2008   Hyperlipidemia 11/02/2006   Allergic rhinitis 11/02/2006   GERD 11/02/2006    Past Surgical History:  Procedure Laterality Date   CHOLECYSTECTOMY  07/18/1998   CYSTOSCOPY WITH BIOPSY N/A 06/05/2022   Procedure: CYSTOSCOPY WITH BIOPSY;  Surgeon: Malen Gauze, MD;  Location: [facility];  Service: Urology;  Laterality: N/A;   CYSTOSCOPY WITH FULGERATION N/A 06/05/2022   Procedure: CYSTOSCOPY WITH FULGERATION;  Surgeon: Malen Gauze, MD;  Location: [facility];  Service: Urology;  Laterality: N/A;   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    OB History     Gravida  2   Para  2   Term  2   Preterm      AB      Living  2      SAB      IAB      Ectopic      Multiple      Live Births  Home Medications    Prior to Admission medications   Medication Sig Start Date End Date Taking? Authorizing Provider  apixaban (ELIQUIS) 5 MG TABS tablet Take 1 tablet (5 mg total) by mouth 2 (two) times daily. 11/24/23   Gilmore Laroche, FNP  Ascorbic Acid (VITAMIN C PO) Take by mouth daily.    [provider]  CALCIUM PO Take 1,200 mg by mouth daily.    [provider]  cetirizine (ZYRTEC ALLERGY) 10 MG tablet Take 1 tablet (10 mg total) by mouth daily. Patient not taking: Reported on 11/30/2023 01/16/21   Durward Parcel, FNP  Cholecalciferol (VITAMIN D-3) 25 MCG (1000 UT) CAPS Take 1,000 Units by mouth daily.    [provider]  ezetimibe (ZETIA) 10 MG tablet  Take 1 tablet (10 mg total) by mouth daily. 12/03/23   Gilmore Laroche, FNP  Garlic 1000 MG CAPS Take 1,000 mg by mouth daily. Patient not taking: Reported on 11/30/2023    [provider]  hydrocortisone cream 0.5 % Apply 1 Application topically 2 (two) times daily. Patient not taking: Reported on 09/02/2023 05/27/22   Gilmore Laroche, FNP  hydrOXYzine (VISTARIL) 25 MG capsule Take 1 capsule (25 mg total) by mouth every 8 (eight) hours as needed. Patient not taking: Reported on 09/02/2023 05/27/22   Gilmore Laroche, FNP  norethindrone (AYGESTIN) 5 MG tablet Take 1/2 tablet daily Patient not taking: Reported on 11/30/2023 06/01/23   Myna Hidalgo, DO  ondansetron (ZOFRAN-ODT) 4 MG disintegrating tablet Take 1 tablet (4 mg total) by mouth every 8 (eight) hours as needed for nausea or vomiting. Patient not taking: Reported on 11/30/2023 10/21/21   Elson Areas, PA-C  rosuvastatin (CRESTOR) 20 MG tablet Take 1 tablet (20 mg total) by mouth daily. 01/28/24   Gilmore Laroche, FNP  VITAMIN A PO Take 2,400 mg by mouth 4 (four) times a week. Patient not taking: Reported on 11/30/2023    [provider]    Family History Family History  Problem Relation Age of Onset   Pancreatic cancer Father     Social History Social History   Tobacco Use   Smoking status: Never   Smokeless tobacco: Never  Vaping Use   Vaping status: Never Used  Substance Use Topics   Alcohol use: Yes    Comment: occasionally   Drug use: No     Allergies   Bee pollen and Isovue [iopamidol]   Review of Systems Review of Systems Per HPI  Physical Exam Triage Vital Signs ED Triage Vitals  Encounter Vitals Group     BP 02/09/24 1815 (!) 143/92     Systolic BP Percentile --      Diastolic BP Percentile --      Pulse Rate 02/09/24 1815 (!) 102     Resp 02/09/24 1815 18     Temp 02/09/24 1815 98.3 F (36.8 C)     Temp Source 02/09/24 1815 Oral     SpO2 02/09/24 1815 96 %     Weight --       Height --      Head Circumference --      Peak Flow --      Pain Score 02/09/24 1818 2     Pain Loc --      Pain Education --      Exclude from Growth Chart --    No data found.  Updated Vital Signs BP (!) 143/92 (BP Location: Right Arm)   Pulse (!) 102  Temp 98.3 F (36.8 C) (Oral)   Resp 18   SpO2 96%   Visual Acuity Right Eye Distance:   Left Eye Distance:   Bilateral Distance:    Right Eye Near:   Left Eye Near:    Bilateral Near:     Physical Exam Vitals and nursing note reviewed.  Constitutional:      General: She is not in acute distress.    Appearance: Normal appearance.  HENT:     Head: Normocephalic.  Eyes:     Extraocular Movements: Extraocular movements intact.     Pupils: Pupils are equal, round, and reactive to light.  Cardiovascular:     Rate and Rhythm: Normal rate and regular rhythm.     Pulses: Normal pulses.     Heart sounds: Normal heart sounds.  Pulmonary:     Effort: Pulmonary effort is normal.     Breath sounds: Normal breath sounds.  Abdominal:     General: Bowel sounds are normal.     Palpations: Abdomen is soft.     Tenderness: There is no abdominal tenderness.  Musculoskeletal:     Cervical back: Normal range of motion.     Left ankle: Swelling (Mild, medial aspect) and ecchymosis present. No deformity. No tenderness. Normal range of motion. Normal pulse.       Legs:  Skin:    General: Skin is warm and dry.  Neurological:     General: No focal deficit present.     Mental Status: She is alert and oriented to person, place, and time.  Psychiatric:        Mood and Affect: Mood normal.        Behavior: Behavior normal.      UC Treatments / Results  Labs (all labs ordered are listed, but only abnormal results are displayed) Labs Reviewed - No data to display  EKG   Radiology No results found.  Procedures Procedures (including critical care time)  Medications Ordered in UC Medications - No data to display  Initial  Impression / Assessment and Plan / UC Course  I have reviewed the triage vital signs and the nursing notes.  Pertinent labs & imaging results that were available during my care of the patient were reviewed by me and considered in my medical decision making (see chart for details).  Do not suspect another DVT at this time.  On exam, patient has multiple varicose veins in the affected area.  Difficult to determine the cause of her current symptoms, also difficult to ascertain the onset of her symptoms given her history.  Supportive care recommendations were provided and discussed with the patient to include patient continuing use of her TED hose while symptoms persist, wearing shoes with good insole and support, and elevating the lower extremities above the level of the heart.  Patient was advised to follow-up with her PCP for further evaluation.  Patient was given strict ER follow-up precautions.  Patient was in agreement with this plan of care and verbalized understanding.  All questions were answered.  Patient stable for discharge.  Final Clinical Impressions(s) / UC Diagnoses   Final diagnoses:  Varicose veins of left ankle     Discharge Instructions      Begin wearing her TED hose daily.  Wear them for at least 12 hours during the day, remove at night, repeat while symptoms persist. Elevate the lower extremity above the level of the heart is much as possible while symptoms persist. Make sure you are  wearing shoes with good insole and support. May take over-the-counter Tylenol as needed for pain or discomfort. Go to the emergency department immediately if you experience chest pain, shortness of breath, difficulty breathing, pain or swelling in the left lower extremity or calf. Follow-up with your primary care physician for further evaluation or with the vascular clinic. Follow-up as needed.     ED Prescriptions   None    PDMP not reviewed this encounter.   Abran Cantor, NP 02/09/24 [phone]

## 2024-02-17 ENCOUNTER — Ambulatory Visit (INDEPENDENT_AMBULATORY_CARE_PROVIDER_SITE_OTHER): Payer: Self-pay

## 2024-02-17 DIAGNOSIS — Z1211 Encounter for screening for malignant neoplasm of colon: Secondary | ICD-10-CM | POA: Diagnosis not present

## 2024-02-17 DIAGNOSIS — I825Y2 Chronic embolism and thrombosis of unspecified deep veins of left proximal lower extremity: Secondary | ICD-10-CM

## 2024-02-17 MED ORDER — ASPIRIN 81 MG PO TBEC
81.0000 mg | DELAYED_RELEASE_TABLET | ORAL | Status: AC
Start: 2024-02-17 — End: ?

## 2024-02-17 NOTE — Progress Notes (Unsigned)
 Established Patient Office Visit  Subjective   Patient ID: Carolyn Raymond, female    DOB: 02-20-1956  Age: 68 y.o. MRN: [id number]  Chief Complaint  Patient presents with   Follow-up    Pt. States went to urgent care last week, been diagnosed with DVT of the lower left extremity, was Orland Jarred of her Eliquis to help with the clotting, Had the bruise since January and was on the eliquis, Pt states that she was "showing her mom something on her phone and it slipped out of her hand and hit her on the top of her ankle"    Patient presents for complaints of bruising to the left ankle along with an occasional stinging sensation in her foot.  Patient states symptoms have been present for the past several months.  Patient states she has a history of DVT and was taking Eliquis.  States in January, she dropped her cell phone on her foot, and also hit her ankle.  She states she was on blood thinners at the time.  She states she has since developed bruising with intermittent swelling to the left ankle.  Patient states that the bruising has since improved, but she is concerned because she continues to experience the bruising with discoloration to the left ankle.  Patient denies fever, chills, ankle pain, shortness of breath, difficulty breathing, chest pain, calf pain or swelling.  Patient states that she spoke with the vascular clinic that she has seen in the past for varicose veins, and they advised that they would most likely start her on blood thinners again.      Past Medical History:  Diagnosis Date   Allergy    Hyperlipidemia    Thyroid disease    nodules      ROS    Objective:     There were no vitals taken for this visit. BP Readings from Last 3 Encounters:  02/09/24 (!) 143/92  11/30/23 120/77  09/02/23 118/83   Wt Readings from Last 3 Encounters:  11/30/23 153 lb 1.9 oz (69.5 kg)  09/02/23 159 lb 1.3 oz (72.2 kg)  08/31/23 158 lb (71.7 kg)      Physical Exam Vitals and  nursing note reviewed.  Constitutional:      Appearance: Normal appearance.  Cardiovascular:     Rate and Rhythm: Normal rate and regular rhythm.  Pulmonary:     Effort: Pulmonary effort is normal.     Breath sounds: Normal breath sounds.  Musculoskeletal:        General: Swelling (mild, right leg and ankle) present.  Skin:    Findings: Bruising (faint bruising across ankle and top of foot) present.  Neurological:     Mental Status: She is alert and oriented to person, place, and time.  Psychiatric:        Mood and Affect: Mood normal.        Thought Content: Thought content normal.      No results found for any visits on 02/17/24.    The 10-year ASCVD risk score (Arnett DK, et al., 2019) is: 9.5%    Assessment & Plan:   Problem List Items Addressed This Visit       Cardiovascular and Mediastinum   DVT (deep venous thrombosis) (HCC) - Primary   She was taken off the Eliquis due to blood in her stool.  She is taking a daily low-dose aspirin in its place.  Advised her to continue to wear compression socks daily and aim for 20-30  minutes of exercise daily.  She has an upcoming appt with vein specialists for further evaluation of her varicose veins.       Relevant Medications   aspirin EC 81 MG tablet   Other Visit Diagnoses       Colon cancer screening       obtain cologuard for colon cancer screening   Relevant Orders   Cologuard       No follow-ups on file.    Darral Dash, FNP

## 2024-02-18 NOTE — Assessment & Plan Note (Signed)
 She was taken off the Eliquis due to blood in her stool.  She is taking a daily low-dose aspirin in its place.  Advised her to continue to wear compression socks daily and aim for 20-30 minutes of exercise daily.  She has an upcoming appt with vein specialists for further evaluation of her varicose veins.

## 2024-03-03 ENCOUNTER — Telehealth: Payer: Self-pay | Admitting: Family Medicine

## 2024-03-03 NOTE — Telephone Encounter (Signed)
 Pt informed. Pt is asking if she should be concerned with the bleeding? States she sees streaks of blood in her stool, especially when she wipes. States she does not experience bleeding with every bowel movement but still very often. Pt denies any other bleeding problems. Wants to know what you advise?

## 2024-03-03 NOTE — Telephone Encounter (Signed)
 Copied from CRM [id number]. Topic: General - Other >> Mar 03, 2024 11:14 AM Emylou G wrote: Reason for CRM: Patient called said is having off on bleeding w/aspirin.. ( she was on blood thinners due to blood clot - dr switched her to aspirin )  Can she be off of aspirin for a lil bit to do her colorguard? Pls call her number on file.

## 2024-03-03 NOTE — Telephone Encounter (Signed)
 Yes, if the patient is experiencing bleeding, she may stop taking aspirin for 3 days prior to completing the Cologuard test.

## 2024-03-08 DIAGNOSIS — Z1211 Encounter for screening for malignant neoplasm of colon: Secondary | ICD-10-CM | POA: Diagnosis not present

## 2024-03-13 LAB — COLOGUARD: COLOGUARD: NEGATIVE

## 2024-03-15 DIAGNOSIS — M79605 Pain in left leg: Secondary | ICD-10-CM | POA: Diagnosis not present

## 2024-03-15 DIAGNOSIS — I82502 Chronic embolism and thrombosis of unspecified deep veins of left lower extremity: Secondary | ICD-10-CM | POA: Diagnosis not present

## 2024-03-15 DIAGNOSIS — R600 Localized edema: Secondary | ICD-10-CM | POA: Diagnosis not present

## 2024-03-15 DIAGNOSIS — I87392 Chronic venous hypertension (idiopathic) with other complications of left lower extremity: Secondary | ICD-10-CM | POA: Diagnosis not present

## 2024-03-15 NOTE — Telephone Encounter (Signed)
 Recommend to complete colonoscopy for further evaluation.

## 2024-03-16 NOTE — Telephone Encounter (Signed)
 Spoke to pt. Cologaurd completed, results in chart

## 2024-03-18 ENCOUNTER — Encounter: Payer: Self-pay | Admitting: Gastroenterology

## 2024-03-18 ENCOUNTER — Other Ambulatory Visit: Payer: Self-pay

## 2024-03-18 ENCOUNTER — Telehealth: Payer: Self-pay

## 2024-03-18 DIAGNOSIS — K921 Melena: Secondary | ICD-10-CM

## 2024-03-18 NOTE — Telephone Encounter (Signed)
 Kindly place a referral to GI for Hematochezia

## 2024-03-18 NOTE — Telephone Encounter (Signed)
Referral sent. Pt informed

## 2024-03-18 NOTE — Telephone Encounter (Signed)
 That's fine

## 2024-03-18 NOTE — Telephone Encounter (Signed)
 Patient advised.

## 2024-03-18 NOTE — Telephone Encounter (Signed)
 Copied from CRM [id number]. Topic: Clinical - Medication Question >> Mar 17, 2024  1:48 PM Talmadge Fail S wrote: Reason for CRM: Patient would like to know if she can take the Maximum Strength Cold and Flu medication due to still having blood clots. Patient is suffering from a cold. Patient is experiencing a runny nose and some light congestion and watery eyes. Please contact patient at [phone].

## 2024-03-28 ENCOUNTER — Other Ambulatory Visit: Payer: Self-pay | Admitting: *Deleted

## 2024-03-28 ENCOUNTER — Encounter: Payer: Self-pay | Admitting: Gastroenterology

## 2024-03-28 ENCOUNTER — Encounter: Payer: Self-pay | Admitting: *Deleted

## 2024-03-28 ENCOUNTER — Ambulatory Visit: Admitting: Gastroenterology

## 2024-03-28 VITALS — BP 122/82 | HR 73 | Temp 98.7°F | Ht 64.5 in | Wt 155.6 lb

## 2024-03-28 DIAGNOSIS — K625 Hemorrhage of anus and rectum: Secondary | ICD-10-CM | POA: Diagnosis not present

## 2024-03-28 DIAGNOSIS — Z8719 Personal history of other diseases of the digestive system: Secondary | ICD-10-CM

## 2024-03-28 MED ORDER — PEG 3350-KCL-NA BICARB-NACL 420 G PO SOLR: 4000.0000 mL | Freq: Once | ORAL | 0 refills | Status: AC

## 2024-03-28 NOTE — Patient Instructions (Signed)
 Colonoscopy to be scheduled. Separate instructions to be provided.

## 2024-03-28 NOTE — Progress Notes (Signed)
 GI Office Note    Referring Provider: Zarwolo, Gloria, FNP Primary Care Physician:  Zarwolo, Gloria, FNP  Primary Gastroenterologist: Rheba Cedar, MD   Chief Complaint   Chief Complaint  Patient presents with   Blood In Stools     History of Present Illness   Carolyn Raymond is a 68 y.o. female presenting today at the request of Gloria Zarwolo, FNP for further evaluation of bleeding.  Patient states she developed a DVT in the left lower extremity back in October 2024.  A couple months prior she has started hormonal therapy for postmenopausal bleeding.  She is not sure if this is what caused her DVT, does not recall any particularly long trips prior to onset, everything less than 2 hours.  He was started on Eliquis , while on therapy she developed intermittent hematochezia.  Happened several times.  In January she transition to 81 mg aspirin  and for short period of time the bleeding stopped.  In April she completed a Cologuard which was negative.  She decided to opt for this instead of colonoscopy because she had several things going on healthwise.  She presents today because she continues to have intermittent rectal bleeding.  Denies rectal pain, significant abdominal pain.  Bowel movements are regular.  Stools are not hard.  No straining.  No heartburn, dysphagia, vomiting.  Recently saw the pain specialist for follow-up of DVT.  States that she did have updated imaging which was reassuring and he recommended continuing aspirin  as she was doing.  Cologuard 02/2024: negative   Colonoscopy 06/2008:  IMPRESSION:  1. Normal rectum, diminutive polyp at rectosigmoid, status post cold      biopsy removal.  2. 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  Remainder of the colonic mucosa and      terminal ileal mucosa appeared normal.  Medications   Current Outpatient Medications  Medication Sig Dispense Refill   Ascorbic  Acid (VITAMIN C PO) Take by mouth daily.     aspirin  EC 81 MG tablet Take 1 tablet (81 mg total) by mouth every other day. Swallow whole.     b complex vitamins capsule Take 1 capsule by mouth daily.     CALCIUM  PO Take 1,200 mg by mouth daily.     cholecalciferol (VITAMIN D3) 25 MCG (1000 UNIT) tablet Take 1,000 Units by mouth daily.     ezetimibe  (ZETIA ) 10 MG tablet Take 1 tablet (10 mg total) by mouth daily. 90 tablet 3   rosuvastatin  (CRESTOR ) 20 MG tablet Take 1 tablet (20 mg total) by mouth daily. 90 tablet 0   No current facility-administered medications for this visit.    Allergies   Allergies as of 03/28/2024 - Review Complete 03/28/2024  Allergen Reaction Noted   Bee pollen Swelling 07/04/2013   Isovue [iopamidol] Itching and Swelling 07/04/2013    Past Medical History   Past Medical History:  Diagnosis Date   Allergy    DVT (deep venous thrombosis) (HCC)    Hyperlipidemia    Thyroid  disease    nodules    Past Surgical History   Past Surgical History:  Procedure Laterality Date   CHOLECYSTECTOMY  07/18/1998   CYSTOSCOPY WITH BIOPSY N/A 06/05/2022   Procedure: CYSTOSCOPY WITH BIOPSY;  Surgeon: Marco Severs, MD;  Location: [facility];  Service: Urology;  Laterality: N/A;   CYSTOSCOPY WITH FULGERATION N/A 06/05/2022   Procedure: CYSTOSCOPY WITH FULGERATION;  Surgeon: Marco Severs,  MD;  Location: [facility];  Service: Urology;  Laterality: N/A;   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    Past Family History   Family History  Problem Relation Age of Onset   Other Mother        age 36   Pancreatic cancer Father    Colon polyps Father        ?resection   Colon cancer Neg Hx     Past Social History   Social History   Socioeconomic History   Marital status: Married    Spouse name: Not on file   Number of children: Not on file   Years of  education: Not on file   Highest education level: Not on file  Occupational History   Not on file  Tobacco Use   Smoking status: Never   Smokeless tobacco: Never  Vaping Use   Vaping status: Never Used  Substance and Sexual Activity   Alcohol use: Yes    Comment: occasionally   Drug use: No   Sexual activity: Yes    Birth control/protection: Post-menopausal  Other Topics Concern   Not on file  Social History Narrative   Not on file   Social Drivers of Health   Financial Resource Strain: Low Risk  (08/26/2022)   Overall Financial Resource Strain (CARDIA)    Difficulty of Paying Living Expenses: Not hard at all  Food Insecurity: No Food Insecurity (08/26/2022)   Hunger Vital Sign    Worried About Running Out of Food in the Last Year: Never true    Ran Out of Food in the Last Year: Never true  Transportation Needs: No Transportation Needs (08/26/2022)   PRAPARE - Administrator, Civil Service (Medical): No    Lack of Transportation (Non-Medical): No  Physical Activity: Sufficiently Active (05/06/2023)   Exercise Vital Sign    Days of Exercise per Week: 5 days    Minutes of Exercise per Session: 50 min  Stress: No Stress Concern Present (08/26/2022)   [employer] of Occupational Health - Occupational Stress Questionnaire    Feeling of Stress : Only a little  Social Connections: Socially Integrated (08/26/2022)   Social Connection and Isolation Panel [NHANES]    Frequency of Communication with Friends and Family: More than three times a week    Frequency of Social Gatherings with Friends and Family: Twice a week    Attends Religious Services: More than 4 times per year    Active Member of [employer] or Organizations: Yes    Attends Banker Meetings: 1 to 4 times per year    Marital Status: Married  Catering manager Violence: Not At Risk (08/26/2022)   Humiliation, Afraid, Rape, and Kick questionnaire    Fear of Current or Ex-Partner: No     Emotionally Abused: No    Physically Abused: No    Sexually Abused: No    Review of Systems   General: Negative for anorexia, weight loss, fever, chills, fatigue, weakness. Eyes: Negative for vision changes.  ENT: Negative for hoarseness, difficulty swallowing , nasal congestion. CV: Negative for chest pain, angina, palpitations, dyspnea on exertion, peripheral edema.  Respiratory: Negative for dyspnea at rest, dyspnea on exertion, cough, sputum, wheezing.  GI: See history of present illness. GU:  Negative for dysuria, hematuria, urinary incontinence, urinary frequency, nocturnal urination.  MS: Negative for  joint pain, low back pain.  Derm: Negative for rash or itching.  Neuro: Negative for weakness, abnormal sensation, seizure, frequent headaches, memory loss,  confusion.  Psych: Negative for anxiety, depression, suicidal ideation, hallucinations.  Endo: Negative for unusual weight change.  Heme: Negative for bruising or bleeding. Allergy: Negative for rash or hives.  Physical Exam   BP 122/82 (BP Location: Right Arm, Patient Position: Sitting, Cuff Size: Normal)   Pulse 73   Temp 98.7 F (37.1 C) (Oral)   Ht 5' 4.5" (1.638 m)   Wt 155 lb 9.6 oz (70.6 kg)   SpO2 98%   BMI 26.30 kg/m    General: Well-nourished, well-developed in no acute distress.  Head: Normocephalic, atraumatic.   Eyes: Conjunctiva pink, no icterus. Mouth: Oropharyngeal mucosa moist and pink  Neck: Supple without thyromegaly, masses, or lymphadenopathy.  Lungs: Clear to auscultation bilaterally.  Heart: Regular rate and rhythm, no murmurs rubs or gallops.  Abdomen: Bowel sounds are normal, nontender, nondistended, no hepatosplenomegaly or masses,  no abdominal bruits or hernia, no rebound or guarding.   Rectal: Not performed Extremities: No lower extremity edema. No clubbing or deformities.  Neuro: Alert and oriented x 4 , grossly normal neurologically.  Skin: Warm and dry, no rash or jaundice.    Psych: Alert and cooperative, normal mood and affect.  Labs   Lab Results  Component Value Date   TSH 1.010 11/30/2023   Lab Results  Component Value Date   WBC 6.9 11/30/2023   HGB 14.0 11/30/2023   HCT 42.7 11/30/2023   MCV 95 11/30/2023   PLT 288 11/30/2023   Lab Results  Component Value Date   ALT 16 11/30/2023   AST 17 11/30/2023   ALKPHOS 110 11/30/2023   BILITOT 0.6 11/30/2023   Lab Results  Component Value Date   NA 140 11/30/2023   CL 101 11/30/2023   K 4.8 11/30/2023   CO2 25 11/30/2023   BUN 14 11/30/2023   CREATININE 0.88 11/30/2023   EGFR 72 11/30/2023   CALCIUM  9.8 11/30/2023   ALBUMIN 4.4 11/30/2023   GLUCOSE 93 11/30/2023   Lab Results  Component Value Date   TSH 1.010 11/30/2023   Lab Results  Component Value Date   HGBA1C 6.2 (H) 11/30/2023    Imaging Studies   No results found.  Assessment/Plan:   Rectal bleeding: Etiology not clear, could be from benign anorectal source such as hemorrhoids, diverticular, AVMs, less likely malignancy given recent negative Cologuard.  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  Could have developed other AVMs as well.  Recommend colonoscopy for further evaluation. -ASA 3, Room 1,2 ok.  I have discussed the risks, alternatives, benefits with regards to but not limited to the risk of reaction to medication, bleeding, infection, perforation and the patient is agreeable to proceed. Written consent to be obtained.   Trudie Fuse. Harles Lied, MHS, PA-C [facility]

## 2024-04-13 ENCOUNTER — Ambulatory Visit (HOSPITAL_COMMUNITY): Admitting: Anesthesiology

## 2024-04-13 ENCOUNTER — Ambulatory Visit (HOSPITAL_COMMUNITY)
Admission: RE | Admit: 2024-04-13 | Discharge: 2024-04-13 | Disposition: A | Discharge status: Home / Self Care | Attending: Internal Medicine | Admitting: Internal Medicine

## 2024-04-13 ENCOUNTER — Encounter (HOSPITAL_COMMUNITY)
Admission: RE | Disposition: A | Discharge status: Home / Self Care | Payer: Self-pay | Source: Home / Self Care | Attending: Internal Medicine

## 2024-04-13 ENCOUNTER — Encounter (HOSPITAL_COMMUNITY): Payer: Self-pay | Admitting: Internal Medicine

## 2024-04-13 ENCOUNTER — Other Ambulatory Visit: Payer: Self-pay

## 2024-04-13 DIAGNOSIS — K219 Gastro-esophageal reflux disease without esophagitis: Secondary | ICD-10-CM | POA: Insufficient documentation

## 2024-04-13 DIAGNOSIS — K640 First degree hemorrhoids: Secondary | ICD-10-CM | POA: Diagnosis not present

## 2024-04-13 DIAGNOSIS — K921 Melena: Secondary | ICD-10-CM | POA: Insufficient documentation

## 2024-04-13 DIAGNOSIS — K920 Hematemesis: Secondary | ICD-10-CM | POA: Diagnosis not present

## 2024-04-13 DIAGNOSIS — I10 Essential (primary) hypertension: Secondary | ICD-10-CM | POA: Diagnosis not present

## 2024-04-13 DIAGNOSIS — D128 Benign neoplasm of rectum: Secondary | ICD-10-CM | POA: Diagnosis not present

## 2024-04-13 DIAGNOSIS — E785 Hyperlipidemia, unspecified: Secondary | ICD-10-CM | POA: Diagnosis not present

## 2024-04-13 DIAGNOSIS — K621 Rectal polyp: Secondary | ICD-10-CM

## 2024-04-13 DIAGNOSIS — K5730 Diverticulosis of large intestine without perforation or abscess without bleeding: Secondary | ICD-10-CM

## 2024-04-13 SURGERY — COLONOSCOPY
Anesthesia: General

## 2024-04-13 MED ORDER — PROPOFOL 500 MG/50ML IV EMUL
INTRAVENOUS | Status: DC | PRN
  Administered 2024-04-13: 125 ug/kg/min via INTRAVENOUS
  Administered 2024-04-13: 60 mg via INTRAVENOUS
  Administered 2024-04-13: 40 mg via INTRAVENOUS

## 2024-04-13 MED ORDER — PHENYLEPHRINE 80 MCG/ML (10ML) SYRINGE FOR IV PUSH (FOR BLOOD PRESSURE SUPPORT)
PREFILLED_SYRINGE | INTRAVENOUS | Status: DC | PRN
  Administered 2024-04-13: 80 ug via INTRAVENOUS
  Administered 2024-04-13: 160 ug via INTRAVENOUS
  Administered 2024-04-13: 80 ug via INTRAVENOUS

## 2024-04-13 MED ORDER — LACTATED RINGERS IV SOLN: INTRAVENOUS | Status: DC

## 2024-04-13 MED ORDER — LIDOCAINE 2% (20 MG/ML) 5 ML SYRINGE
INTRAMUSCULAR | Status: DC | PRN
Start: 2024-04-13 — End: 2024-04-13
  Administered 2024-04-13: 60 mg via INTRAVENOUS

## 2024-04-13 NOTE — Transfer of Care (Signed)
 Immediate Anesthesia Transfer of Care Note  Patient: Carolyn Raymond  Procedure(s) Performed: COLONOSCOPY  Patient Location: Endoscopy Unit  Anesthesia Type:General  Level of Consciousness: drowsy and patient cooperative  Airway & Oxygen Therapy: Patient Spontanous Breathing and Patient connected to nasal cannula oxygen  Post-op Assessment: Report given to RN and Post -op Vital signs reviewed and stable  Post vital signs: Reviewed and stable  Last Vitals:  Vitals Value Taken Time  BP 91/43 04/13/24 1413  Temp 36.5 C 04/13/24 1413  Pulse 69 04/13/24 1413  Resp 16 04/13/24 1413  SpO2 100 % 04/13/24 1413    Last Pain:  Vitals:   04/13/24 1413  TempSrc: Oral  PainSc: 0-No pain         Complications: No notable events documented.

## 2024-04-13 NOTE — Discharge Instructions (Addendum)
  Colonoscopy Discharge Instructions  Read the instructions outlined below and refer to this sheet in the next few weeks. These discharge instructions provide you with general information on caring for yourself after you leave the hospital. Your doctor may also give you specific instructions. While your treatment has been planned according to the most current medical practices available, unavoidable complications occasionally occur. If you have any problems or questions after discharge, call Dr. Riley Cheadle at [phone]. ACTIVITY You may resume your regular activity, but move at a slower pace for the next 24 hours.  Take frequent rest periods for the next 24 hours.  Walking will help get rid of the air and reduce the bloated feeling in your belly (abdomen).  No driving for 24 hours (because of the medicine (anesthesia) used during the test).   Do not sign any important legal documents or operate any machinery for 24 hours (because of the anesthesia used during the test).  NUTRITION Drink plenty of fluids.  You may resume your normal diet as instructed by your doctor.  Begin with a light meal and progress to your normal diet. Heavy or fried foods are harder to digest and may make you feel sick to your stomach (nauseated).  Avoid alcoholic beverages for 24 hours or as instructed.  MEDICATIONS You may resume your normal medications unless your doctor tells you otherwise.  WHAT YOU CAN EXPECT TODAY Some feelings of bloating in the abdomen.  Passage of more gas than usual.  Spotting of blood in your stool or on the toilet paper.  IF YOU HAD POLYPS REMOVED DURING THE COLONOSCOPY: No aspirin  products for 7 days or as instructed.  No alcohol for 7 days or as instructed.  Eat a soft diet for the next 24 hours.  FINDING OUT THE RESULTS OF YOUR TEST Not all test results are available during your visit. If your test results are not back during the visit, make an appointment with your caregiver to find out the  results. Do not assume everything is normal if you have not heard from your caregiver or the medical facility. It is important for you to follow up on all of your test results.  SEEK IMMEDIATE MEDICAL ATTENTION IF: You have more than a spotting of blood in your stool.  Your belly is swollen (abdominal distention).  You are nauseated or vomiting.  You have a temperature over 101.  You have abdominal pain or discomfort that is severe or gets worse throughout the day.    You have hemorrhoids.  You have diverticulosis  You were also found to have a large polyp just inside your anal canal.  This is likely the cause of rectal bleeding.  It was removed.    Further recommendations to follow pending review of pathology report  Office visit with Azalee Lenz in 3 months

## 2024-04-13 NOTE — H&P (Signed)
 @LOGO @   Primary Care Physician:  Zarwolo, Gloria, FNP Primary Gastroenterologist:  Dr. Riley Cheadle  Pre-Procedure History & Physical: HPI:  Carolyn Raymond is a 68 y.o. female here for further evaluation of rectal bleeding reported history of right colon AVMs never treated.  Prior colonoscopy elsewhere.  She is not anticoagulated.  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    Prior to Admission medications   Medication Sig Start Date End Date Taking? Authorizing Provider  Ascorbic Acid (VITAMIN C PO) Take by mouth daily.   Yes [provider]  aspirin  EC 81 MG tablet Take 1 tablet (81 mg total) by mouth every other day. Swallow whole. 02/17/24  Yes Alison Irvine, FNP  b complex vitamins capsule Take 1 capsule by mouth daily.   Yes [provider]  CALCIUM  PO Take 1,200 mg by mouth daily.   Yes [provider]  cholecalciferol (VITAMIN D3) 25 MCG (1000 UNIT) tablet Take 1,000 Units by mouth daily.   Yes [provider]  ezetimibe  (ZETIA ) 10 MG tablet Take 1 tablet (10 mg total) by mouth daily. 12/03/23  Yes Zarwolo, Gloria, FNP  rosuvastatin  (CRESTOR ) 20 MG tablet Take 1 tablet (20 mg total) by mouth daily. 01/28/24  Yes Zarwolo, Gloria, FNP    Allergies as of 03/28/2024 - Review Complete  03/28/2024  Allergen Reaction Noted   Bee pollen Swelling 07/04/2013   Isovue [iopamidol] Itching and Swelling 07/04/2013    Family History  Problem Relation Age of Onset   Other Mother        age 72   Pancreatic cancer Father    Colon polyps Father        ?resection   Colon cancer Neg Hx     Social History   Socioeconomic History   Marital status: Married    Spouse name: Not on file   Number of children: Not on file   Years of education: Not on file   Highest education level: Not on file  Occupational History   Not on file  Tobacco Use   Smoking status: Never   Smokeless tobacco: Never  Vaping Use   Vaping status: Never Used  Substance and Sexual Activity   Alcohol use: Not Currently   Drug use: No   Sexual activity: Yes    Birth control/protection: Post-menopausal  Other Topics Concern   Not on file  Social History Narrative   Not on file   Social Drivers of Health   Financial Resource Strain: Low Risk  (08/26/2022)   Overall Financial Resource Strain (CARDIA)    Difficulty of Paying Living Expenses: Not hard at all  Food Insecurity: No Food Insecurity (08/26/2022)   Hunger Vital Sign    Worried About Programme researcher, broadcasting/film/video in  the Last Year: Never true    Ran Out of Food in the Last Year: Never true  Transportation Needs: No Transportation Needs (08/26/2022)   PRAPARE - Administrator, Civil Service (Medical): No    Lack of Transportation (Non-Medical): No  Physical Activity: Sufficiently Active (05/06/2023)   Exercise Vital Sign    Days of Exercise per Week: 5 days    Minutes of Exercise per Session: 50 min  Stress: No Stress Concern Present (08/26/2022)   [employer] of Occupational Health - Occupational Stress Questionnaire    Feeling of Stress : Only a little  Social Connections: Socially Integrated (08/26/2022)   Social Connection and Isolation Panel [NHANES]    Frequency of Communication with Friends and Family: More than three times  a week    Frequency of Social Gatherings with Friends and Family: Twice a week    Attends Religious Services: More than 4 times per year    Active Member of [employer] or Organizations: Yes    Attends Banker Meetings: 1 to 4 times per year    Marital Status: Married  Catering manager Violence: Not At Risk (08/26/2022)   Humiliation, Afraid, Rape, and Kick questionnaire    Fear of Current or Ex-Partner: No    Emotionally Abused: No    Physically Abused: No    Sexually Abused: No    Review of Systems: See HPI, otherwise negative ROS  Physical Exam: BP 117/72   Pulse 92   Temp 99 F (37.2 C) (Oral)   Resp 17   Ht 5' 4.5" (1.638 m)   Wt 69.9 kg   SpO2 96%   BMI 26.03 kg/m  General:   Alert,  Well-developed, well-nourished, pleasant and cooperative in NAD Neck:  Supple; no masses or thyromegaly. No significant cervical adenopathy. Lungs:  Clear throughout to auscultation.   No wheezes, crackles, or rhonchi. No acute distress. Heart:  Regular rate and rhythm; no murmurs, clicks, rubs,  or gallops. Abdomen: Non-distended, normal bowel sounds.  Soft and nontender without appreciable mass or hepatosplenomegaly.   Impression/Plan: 68 year old lady with intermittent rectal bleeding.  Colonoscopy now being performed. The risks, benefits, limitations, alternatives and imponderables have been reviewed with the patient. Questions have been answered. All parties are agreeable.       Notice: This dictation was prepared with Dragon dictation along with smaller phrase technology. Any transcriptional errors that result from this process are unintentional and may not be corrected upon review.

## 2024-04-13 NOTE — Op Note (Signed)
 [facility] Patient Name: Carolyn Raymond Procedure Date: 04/13/2024 1:20 PM MRN: [id number] Date of Birth: 1956-02-23 Attending MD: Gemma Kelp , MD, [id number] CSN: [id number] Age: 68 Admit Type: Outpatient Procedure:                Colonoscopy Indications:              Hematochezia Providers:                Gemma Kelp, MD, Willena Harp,                            Jolee Naval, Technician Referring MD:             Gemma Kelp, MD Medicines:                Propofol  per Anesthesia Complications:            No immediate complications. Estimated Blood Loss:     Estimated blood loss: none. Procedure:                Pre-Anesthesia Assessment:                           - Prior to the procedure, a History and Physical                            was performed, and patient medications and                            allergies were reviewed. The patient's tolerance of                            previous anesthesia was also reviewed. The risks                            and benefits of the procedure and the sedation                            options and risks were discussed with the patient.                            All questions were answered, and informed consent                            was obtained. Prior Anticoagulants: The patient has                            taken no anticoagulant or antiplatelet agents. ASA                            Grade Assessment: II - A patient with mild systemic                            disease. After reviewing the risks and benefits,  the patient was deemed in satisfactory condition to                            undergo the procedure.                           After obtaining informed consent, the colonoscope                            was passed under direct vision. Throughout the                            procedure, the patient's blood pressure, pulse, and                             oxygen saturations were monitored continuously. The                            [id number]) scope was introduced through the                            anus and advanced to the the cecum, identified by                            appendiceal orifice and ileocecal valve. The                            colonoscopy was performed without difficulty. The                            patient tolerated the procedure well. The quality                            of the bowel preparation was adequate. The entire                            colon was well visualized. Scope In: 1:50:23 PM Scope Out: 2:07:34 PM Scope Withdrawal Time: 0 hours 11 minutes 58 seconds  Total Procedure Duration: 0 hours 17 minutes 11 seconds  Findings:      The perianal and digital rectal examinations were normal.      Non-bleeding internal hemorrhoids were found during retroflexion. The       hemorrhoids were moderate, medium-sized and Grade I (internal       hemorrhoids that do not prolapse).      Scattered medium-mouthed diverticula were found in the sigmoid colon and       descending colon.      A 15 mm polyp was found in the rectum. The polyp was pedunculated. The       polyp was removed with a hot snare. Resection and retrieval were       complete. Estimated blood loss: none.. Pure a stat applied      The exam was otherwise without abnormality on direct and retroflexion       views. Impression:               - Non-bleeding  internal hemorrhoids.                           - Diverticulosis in the sigmoid colon and in the                            descending colon.                           - One 15 mm polyp in the rectum, removed with a hot                            snare. Resected and retrieved.. Stat applied                           - The examination was otherwise normal on direct                            and retroflexion views.                           -I suspect distal rectal polyp source of                             hematochezia although patient does have hemorrhoids                            which could also be the culprit. Moderate Sedation:      Moderate (conscious) sedation was personally administered by an       anesthesia professional. The following parameters were monitored: oxygen       saturation, heart rate, blood pressure, respiratory rate, EKG, adequacy       of pulmonary ventilation, and response to care. Recommendation:           - Patient has a contact number available for                            emergencies. The signs and symptoms of potential                            delayed complications were discussed with the                            patient. Return to normal activities tomorrow.                            Written discharge instructions were provided to the                            patient.                           - Advance diet as tolerated.                           - Continue present medications.                           -  Repeat colonoscopy date to be determined after                            pending pathology results are reviewed for                            surveillance.                           - Return to GI office in 3 months Procedure Code(s):        --- Professional ---                           [phone], Colonoscopy, flexible; with removal of                            tumor(s), polyp(s), or other lesion(s) by snare                            technique Diagnosis Code(s):        --- Professional ---                           K64.0, First degree hemorrhoids                           D12.8, Benign neoplasm of rectum                           K92.1, Melena (includes Hematochezia)                           K57.30, Diverticulosis of large intestine without                            perforation or abscess without bleeding CPT copyright 2022 American Medical Association. All rights reserved. The codes documented in this report are preliminary and  upon coder review may  be revised to meet current compliance requirements. [name]. Tika Hannis, MD Gemma Kelp, MD 04/13/2024 2:18:38 PM This report has been signed electronically. Number of Addenda: 0

## 2024-04-13 NOTE — Anesthesia Preprocedure Evaluation (Signed)
 Anesthesia Evaluation  Patient identified by MRN, date of birth, ID band Patient awake    Reviewed: Allergy & Precautions, H&P , NPO status , Patient's Chart, lab work & pertinent test results, reviewed documented beta blocker date and time   Airway Mallampati: II  TM Distance: >3 FB Neck ROM: full    Dental no notable dental hx.    Pulmonary neg pulmonary ROS   Pulmonary exam normal breath sounds clear to auscultation       Cardiovascular Exercise Tolerance: Good hypertension, negative cardio ROS  Rhythm:regular Rate:Normal     Neuro/Psych  PSYCHIATRIC DISORDERS      negative neurological ROS  negative psych ROS   GI/Hepatic negative GI ROS, Neg liver ROS,GERD  ,,  Endo/Other  negative endocrine ROS    Renal/GU Renal diseasenegative Renal ROS  negative genitourinary   Musculoskeletal   Abdominal   Peds  Hematology negative hematology ROS (+)   Anesthesia Other Findings   Reproductive/Obstetrics negative OB ROS                             Anesthesia Physical Anesthesia Plan  ASA: 3  Anesthesia Plan: General   Post-op Pain Management:    Induction:   PONV Risk Score and Plan: Propofol  infusion  Airway Management Planned:   Additional Equipment:   Intra-op Plan:   Post-operative Plan:   Informed Consent: I have reviewed the patients History and Physical, chart, labs and discussed the procedure including the risks, benefits and alternatives for the proposed anesthesia with the patient or authorized representative who has indicated his/her understanding and acceptance.     Dental Advisory Given  Plan Discussed with: CRNA  Anesthesia Plan Comments:        Anesthesia Quick Evaluation

## 2024-04-14 ENCOUNTER — Encounter (HOSPITAL_COMMUNITY): Payer: Self-pay | Admitting: Internal Medicine

## 2024-04-14 ENCOUNTER — Telehealth: Payer: Self-pay

## 2024-04-14 NOTE — Telephone Encounter (Signed)
 Copied from CRM [id number]. Topic: Clinical - Medication Question >> Apr 13, 2024  4:52 PM Bridgette Campus T wrote: Reason for CRM: patient just had colonoscopy and instructions say no aspirin  products for 7 days, wants to know what to do- [phone]

## 2024-04-14 NOTE — Telephone Encounter (Signed)
 Spoke to patient, advised her to contact gastro for further instructions

## 2024-04-15 LAB — SURGICAL PATHOLOGY

## 2024-04-17 NOTE — Anesthesia Postprocedure Evaluation (Signed)
 Anesthesia Post Note  Patient: Carolyn Raymond  Procedure(s) Performed: COLONOSCOPY  Patient location during evaluation: Phase II Anesthesia Type: General Level of consciousness: awake Pain management: pain level controlled Vital Signs Assessment: post-procedure vital signs reviewed and stable Respiratory status: spontaneous breathing and respiratory function stable Cardiovascular status: blood pressure returned to baseline and stable Postop Assessment: no headache and no apparent nausea or vomiting Anesthetic complications: no Comments: Late entry   No notable events documented.   Last Vitals:  Vitals:   04/13/24 1206 04/13/24 1413  BP: 117/72 (!) 91/43  Pulse: 92 69  Resp: 17 16  Temp: 37.2 C 36.5 C  SpO2: 96% 100%    Last Pain:  Vitals:   04/13/24 1413  TempSrc: Oral  PainSc: 0-No pain                 Coretha Dew

## 2024-04-18 ENCOUNTER — Ambulatory Visit: Payer: Self-pay | Admitting: Internal Medicine

## 2024-04-26 DIAGNOSIS — I872 Venous insufficiency (chronic) (peripheral): Secondary | ICD-10-CM | POA: Diagnosis not present

## 2024-04-26 DIAGNOSIS — I83893 Varicose veins of bilateral lower extremities with other complications: Secondary | ICD-10-CM | POA: Diagnosis not present

## 2024-04-26 DIAGNOSIS — R600 Localized edema: Secondary | ICD-10-CM | POA: Diagnosis not present

## 2024-05-09 ENCOUNTER — Other Ambulatory Visit: Payer: Self-pay | Admitting: Family Medicine

## 2024-05-09 DIAGNOSIS — E7849 Other hyperlipidemia: Secondary | ICD-10-CM

## 2024-05-09 MED ORDER — ROSUVASTATIN CALCIUM 20 MG PO TABS: 20.0000 mg | ORAL_TABLET | Freq: Every day | ORAL | 0 refills | Status: DC

## 2024-05-09 NOTE — Telephone Encounter (Signed)
 Copied from CRM [id number]. Topic: Clinical - Medication Refill >> May 09, 2024  2:33 PM Gustabo D wrote: Medication: rosuvastatin  (CRESTOR ) 20 MG tablet  Has the patient contacted their pharmacy? No (Agent: If no, request that the patient contact the pharmacy for the refill. If patient does not wish to contact the pharmacy document the reason why and proceed with request.) (Agent: If yes, when and what did the pharmacy advise?)  This is the patient's preferred pharmacy:  [facility] [address] [place] - [address] [place] [address] Phone: [phone] Fax: [phone]    Is this the correct pharmacy for this prescription? Yes If no, delete pharmacy and type the correct one.   Has the prescription been filled recently? No  Is the patient out of the medication? No, has a few left  Has the patient been seen for an appointment in the last year OR does the patient have an upcoming appointment? Yes  Can we respond through MyChart? No  Agent: Please be advised that Rx refills may take up to 3 business days. We ask that you follow-up with your pharmacy.

## 2024-05-10 ENCOUNTER — Ambulatory Visit (INDEPENDENT_AMBULATORY_CARE_PROVIDER_SITE_OTHER): Payer: Medicare HMO

## 2024-05-10 VITALS — Ht 64.5 in | Wt 153.0 lb

## 2024-05-10 DIAGNOSIS — Z780 Asymptomatic menopausal state: Secondary | ICD-10-CM

## 2024-05-10 DIAGNOSIS — Z1231 Encounter for screening mammogram for malignant neoplasm of breast: Secondary | ICD-10-CM

## 2024-05-10 DIAGNOSIS — Z0000 Encounter for general adult medical examination without abnormal findings: Secondary | ICD-10-CM

## 2024-05-10 DIAGNOSIS — Z0100 Encounter for examination of eyes and vision without abnormal findings: Secondary | ICD-10-CM

## 2024-05-10 NOTE — Progress Notes (Signed)
 Subjective:   Carolyn Raymond is a 68 y.o. who presents for a Medicare Wellness preventive visit.  As a reminder, Annual Wellness Visits don't include a physical exam, and some assessments may be limited, especially if this visit is performed virtually. We may recommend an in-person follow-up visit with your provider if needed.  Visit Complete: Virtual I connected with  Suzen VEAR Essex on 05/10/24 by a audio enabled telemedicine application and verified that I am speaking with the correct person using two identifiers.  Patient Location: Home  Provider Location: Home Office  I discussed the limitations of evaluation and management by telemedicine. The patient expressed understanding and agreed to proceed.  Vital Signs: Because this visit was a virtual/telehealth visit, some criteria may be missing or patient reported. Any vitals not documented were not able to be obtained and vitals that have been documented are patient reported.  VideoDeclined- This patient declined Librarian, academic. Therefore the visit was completed with audio only.  Persons Participating in Visit: Patient.  AWV Questionnaire: No: Patient Medicare AWV questionnaire was not completed prior to this visit.  Cardiac Risk Factors include: advanced age (>21men, >61 women);dyslipidemia;sedentary lifestyle;Other (see comment), Risk factor comments: hx of DVT     Objective:    Today's Vitals   05/10/24 1105  Weight: 153 lb (69.4 kg)  Height: 5' 4.5 (1.638 m)   Body mass index is 25.86 kg/m.     05/10/2024   11:01 AM 04/13/2024   11:58 AM 05/06/2023    9:53 AM 06/05/2022    7:49 AM 06/03/2022    1:25 PM 05/01/2022    9:29 AM 03/12/2022   10:00 AM  Advanced Directives  Does Patient Have a Medical Advance Directive? No No No No No No No  Would patient like information on creating a medical advance directive? Yes (MAU/Ambulatory/Procedural Areas - Information given) No - Patient  declined Yes (MAU/Ambulatory/Procedural Areas - Information given) No - Patient declined No - Patient declined No - Patient declined     Current Medications (verified) Outpatient Encounter Medications as of 05/10/2024  Medication Sig   Ascorbic Acid (VITAMIN C PO) Take by mouth daily.   aspirin  EC 81 MG tablet Take 1 tablet (81 mg total) by mouth every other day. Swallow whole.   b complex vitamins capsule Take 1 capsule by mouth daily.   CALCIUM  PO Take 1,200 mg by mouth daily.   cholecalciferol (VITAMIN D3) 25 MCG (1000 UNIT) tablet Take 1,000 Units by mouth daily.   ezetimibe  (ZETIA ) 10 MG tablet Take 1 tablet (10 mg total) by mouth daily.   rosuvastatin  (CRESTOR ) 20 MG tablet Take 1 tablet (20 mg total) by mouth daily.   No facility-administered encounter medications on file as of 05/10/2024.    Allergies (verified) Bee pollen and Isovue [iopamidol]   History: Past Medical History:  Diagnosis Date   Allergy    DVT (deep venous thrombosis) (HCC)    Hyperlipidemia    Thyroid  disease    nodules   Past Surgical History:  Procedure Laterality Date   CHOLECYSTECTOMY  07/18/1998   COLONOSCOPY N/A 04/13/2024   Procedure: COLONOSCOPY;  Surgeon: Shaaron Lamar HERO, MD;  Location: [facility];  Service: Endoscopy;  Laterality: N/A;  1:15 pm, ok rm 1/2   CYSTOSCOPY WITH BIOPSY N/A 06/05/2022   Procedure: CYSTOSCOPY WITH BIOPSY;  Surgeon: Sherrilee Belvie CROME, MD;  Location: [facility];  Service: Urology;  Laterality: N/A;   CYSTOSCOPY WITH FULGERATION N/A 06/05/2022  Procedure: CYSTOSCOPY WITH FULGERATION;  Surgeon: Sherrilee Belvie CROME, MD;  Location: [facility];  Service: Urology;  Laterality: N/A;   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   Family History  Problem Relation Age of Onset   Other Mother        age 72   Pancreatic cancer Father    Colon polyps Father        ?resection    Colon cancer Neg Hx    Social History   Socioeconomic History   Marital status: Married    Spouse name: Not on file   Number of children: Not on file   Years of education: Not on file   Highest education level: Not on file  Occupational History   Not on file  Tobacco Use   Smoking status: Never   Smokeless tobacco: Never  Vaping Use   Vaping status: Never Used  Substance and Sexual Activity   Alcohol use: Not Currently   Drug use: No   Sexual activity: Yes    Birth control/protection: Post-menopausal  Other Topics Concern   Not on file  Social History Narrative   Not on file   Social Drivers of Health   Financial Resource Strain: Low Risk  (05/10/2024)   Overall Financial Resource Strain (CARDIA)    Difficulty of Paying Living Expenses: Not hard at all  Food Insecurity: No Food Insecurity (05/10/2024)   Hunger Vital Sign    Worried About Running Out of Food in the Last Year: Never true    Ran Out of Food in the Last Year: Never true  Transportation Needs: No Transportation Needs (05/10/2024)   PRAPARE - Administrator, Civil Service (Medical): No    Lack of Transportation (Non-Medical): No  Physical Activity: Insufficiently Active (05/10/2024)   Exercise Vital Sign    Days of Exercise per Week: 2 days    Minutes of Exercise per Session: 20 min  Stress: No Stress Concern Present (05/10/2024)   [employer] of Occupational Health - Occupational Stress Questionnaire    Feeling of Stress: Not at all  Social Connections: Moderately Integrated (05/10/2024)   Social Connection and Isolation Panel    Frequency of Communication with Friends and Family: More than three times a week    Frequency of Social Gatherings with Friends and Family: More than three times a week    Attends Religious Services: More than 4 times per year    Active Member of [employer] or Organizations: No    Attends Engineer, structural: Never    Marital Status: Married    Tobacco  Counseling Counseling given: Yes    Clinical Intake:  Pre-visit preparation completed: Yes  Pain : No/denies pain     BMI - recorded: 25.86 Nutritional Status: BMI 25 -29 Overweight Nutritional Risks: None Diabetes: No  Lab Results  Component Value Date   HGBA1C 6.2 (H) 11/30/2023   HGBA1C 6.0 (H) 04/29/2023   HGBA1C 6.0 (H) 12/24/2022     How often do you need to have someone help you when you read instructions, pamphlets, or other written materials from your doctor or pharmacy?: 1 - Never  Interpreter Needed?: No  Information entered by :: Stefano ORN CMA   Activities of Daily Living     05/10/2024   11:10 AM  In your present state of health, do you have  any difficulty performing the following activities:  Hearing? 0  Vision? 0  Difficulty concentrating or making decisions? 0  Walking or climbing stairs? 0  Dressing or bathing? 0  Doing errands, shopping? 0  Preparing Food and eating ? N  Using the Toilet? N  In the past six months, have you accidently leaked urine? N  Do you have problems with loss of bowel control? N  Managing your Medications? N  Managing your Finances? N  Housekeeping or managing your Housekeeping? N    Patient Care Team: Zarwolo, Gloria, FNP as PCP - General (Family Medicine)  I have updated your Care Teams any recent Medical Services you may have received from other providers in the past year.     Assessment:   This is a routine wellness examination for [place].  Hearing/Vision screen Hearing Screening - Comments:: Patient denies any hearing difficulties.   Vision Screening - Comments:: No eye doctor. Referral placed for Dr. Octavia   Goals Addressed             This Visit's Progress    Patient Stated   On track    Patient would like to make good food choices.      Patient Stated       I'd like to improve my health and travel more.        Depression Screen     05/10/2024   11:12 AM 02/17/2024    1:58 PM 09/02/2023     4:33 PM 09/02/2023    4:27 PM 05/06/2023    9:53 AM 04/29/2023    9:57 AM 02/09/2023    2:56 PM  PHQ 2/9 Scores  PHQ - 2 Score 0 0 0 0 0 0 0  PHQ- 9 Score 0 1 1 0  0 1    Fall Risk     05/10/2024   11:07 AM 02/17/2024    1:58 PM 11/30/2023   10:08 AM 09/02/2023    4:33 PM 09/02/2023    4:27 PM  Fall Risk   Falls in the past year? 0 0 0 0 0  Number falls in past yr: 0 0 0 0 0  Injury with Fall? 0 0 0 0 0  Risk for fall due to : No Fall Risks No Fall Risks No Fall Risks No Fall Risks No Fall Risks  Follow up Falls evaluation completed;Education provided;Falls prevention discussed Falls evaluation completed;Education provided;Falls prevention discussed Falls evaluation completed Falls evaluation completed Falls evaluation completed    MEDICARE RISK AT HOME:  Medicare Risk at Home Any stairs in or around the home?: Yes If so, are there any without handrails?: No Home free of loose throw rugs in walkways, pet beds, electrical cords, etc?: Yes Adequate lighting in your home to reduce risk of falls?: Yes Life alert?: No Use of a cane, walker or w/c?: No Grab bars in the bathroom?: No Shower chair or bench in shower?: No Elevated toilet seat or a handicapped toilet?: Yes  TIMED UP AND GO:  Was the test performed?  No  Cognitive Function: 6CIT completed    05/01/2022    9:32 AM  MMSE - Mini Mental State Exam  Not completed: Unable to complete        05/10/2024   11:11 AM 05/06/2023    9:54 AM 05/01/2022    9:32 AM  6CIT Screen  What Year? 0 points 0 points 0 points  What month? 0 points 0 points 0 points  What time? 0 points  0 points 0 points  Count back from 20 0 points 0 points 0 points  Months in reverse 0 points 0 points 0 points  Repeat phrase 0 points 0 points 0 points  Total Score 0 points 0 points 0 points    Immunizations Immunization History  Administered Date(s) Administered   Influenza,inj,Quad PF,6+ Mos 07/30/2016   Moderna Sars-Covid-2 Vaccination  04/03/2020, 05/03/2020   Td 01/29/2010   Tdap 08/11/2022    Screening Tests Health Maintenance  Topic Date Due   Zoster Vaccines- Shingrix (1 of 2) Never done   COVID-19 Vaccine (3 - 2024-25 season) 07/19/2023   DEXA SCAN  04/09/2024   MAMMOGRAM  06/03/2024   INFLUENZA VACCINE  06/17/2024   Medicare Annual Wellness (AWV)  05/10/2025   DTaP/Tdap/Td (3 - Td or Tdap) 08/11/2032   Colonoscopy  04/13/2034   Hepatitis C Screening  Completed   Hepatitis B Vaccines  Aged Out   HPV VACCINES  Aged Out   Meningococcal B Vaccine  Aged Out   Pneumococcal Vaccine: 50+ Years  Discontinued    Health Maintenance  Health Maintenance Due  Topic Date Due   Zoster Vaccines- Shingrix (1 of 2) Never done   COVID-19 Vaccine (3 - 2024-25 season) 07/19/2023   DEXA SCAN  04/09/2024   Health Maintenance Items Addressed: Mammogram ordered, DEXA ordered, Referral sent to Optometry/Ophthalmology  Additional Screening:  Vision Screening: Recommended annual ophthalmology exams for early detection of glaucoma and other disorders of the eye. Would you like a referral to an eye doctor? Yes    Dental Screening: Recommended annual dental exams for proper oral hygiene  Community Resource Referral / Chronic Care Management: CRR required this visit?  No   CCM required this visit?  No   Plan: Mammogram and bone density scan ordered. Referral placed for Dr. Octavia. Patient aware and in agreement with treatment plan.     I have personally reviewed and noted the following in the patient's chart:   Medical and social history Use of alcohol, tobacco or illicit drugs  Current medications and supplements including opioid prescriptions. Patient is not currently taking opioid prescriptions. Functional ability and status Nutritional status Physical activity Advanced directives List of other physicians Hospitalizations, surgeries, and ER visits in previous 12 months Vitals Screenings to include cognitive,  depression, and falls Referrals and appointments  In addition, I have reviewed and discussed with patient certain preventive protocols, quality metrics, and best practice recommendations. A written personalized care plan for preventive services as well as general preventive health recommendations were provided to patient.   Raphaella Larkin, CMA   05/10/2024   After Visit Summary: (MyChart) Due to this being a telephonic visit, the after visit summary with patients personalized plan was offered to patient via MyChart   Notes: See note under plan tab in this progress note

## 2024-05-10 NOTE — Patient Instructions (Signed)
 [name] ,  Thank you for taking time out of your busy schedule to complete your Annual Wellness Visit with me. I enjoyed our conversation and look forward to speaking with you again next year. I, as well as your care team,  appreciate your ongoing commitment to your health goals. Please review the following plan we discussed and let me know if I can assist you in the future.  I enjoyed our conversation and look forward to it again next year. Blessing for the upcoming year!!  -Kadence Mimbs  Your Game plan/ To Do List    Referrals Placed:  You have been referred to [facility] for a complete eye exam. If you haven't heard from them in a few days, please call them to schedule your appointment.  [facility] [address] [place] [address] Phone: [phone]  Osteoporosis Screening/ Mammogram Please call the number below to schedule your appt. [facility] Phone: [phone]  Follow up Visits:  Next appointment with PCP: Jul 15, 2024  Medicare AWV with health advisor: May 15, 2025 at 10:00am  Clinician Recommendations:  Aim for 30 minutes of exercise or brisk walking, 6-8 glasses of water , and 5 servings of fruits and vegetables each day.       This is a list of the screening recommended for you and due dates:  Health Maintenance  Topic Date Due   Zoster (Shingles) Vaccine (1 of 2) Never done   COVID-19 Vaccine (3 - 2024-25 season) 07/19/2023   DEXA scan (bone density measurement)  04/09/2024   Mammogram  06/03/2024   Flu Shot  06/17/2024   Medicare Annual Wellness Visit  05/10/2025   DTaP/Tdap/Td vaccine (3 - Td or Tdap) 08/11/2032   Colon Cancer Screening  04/13/2034   Hepatitis C Screening  Completed   Hepatitis B Vaccine  Aged Out   HPV Vaccine  Aged Out   Meningitis B Vaccine  Aged Out   Pneumococcal Vaccine for age over 38  Discontinued    Advanced directives: (Provided) Advance directive discussed with you today. I have provided a  copy for you to complete at home and have notarized. Once this is complete, please bring a copy in to our office so we can scan it into your chart.  Advance Care Planning is important because it:  [x]  Makes sure you receive the medical care that is consistent with your values, goals, and preferences  [x]  It provides guidance to your family and loved ones and reduces their decisional burden about whether or not they are making the right decisions based on your wishes.  Follow the link provided in your after visit summary or read over the paperwork we have mailed to you to help you started getting your Advance Directives in place. If you need assistance in completing these, please reach out to us  so that we can help you!  Understanding Your Risk for Falls Millions of people have serious injuries from falls each year. It is important to understand your risk of falling. Talk with your health care provider about your risk and what you can do to lower it. If you do have a serious fall, make sure to tell your provider. Falling once raises your risk of falling again. How can falls affect me? Serious injuries from falls are common. These include: Broken bones, such as hip fractures. Head injuries, such as traumatic brain injuries (TBI) or concussions. A fear of falling can cause you  to avoid activities and stay at home. This can make your muscles weaker and raise your risk for a fall. What can increase my risk? There are a number of risk factors that increase your risk for falling. The more risk factors you have, the higher your risk of falling. Serious injuries from a fall happen most often to people who are older than 69 years old. Teenagers and young adults ages 65-29 are also at higher risk. Common risk factors include: Weakness in the lower body. Being generally weak or confused due to long-term (chronic) illness. Dizziness or balance problems. Poor vision. Medicines that cause dizziness or  drowsiness. These may include: Medicines for your blood pressure, heart, anxiety, insomnia, or swelling (edema). Pain medicines. Muscle relaxants. Other risk factors include: Drinking alcohol. Having had a fall in the past. Having foot pain or wearing improper footwear. Working at a dangerous job. Having any of the following in your home: Tripping hazards, such as floor clutter or loose rugs. Poor lighting. Pets. Having dementia or memory loss. What actions can I take to lower my risk of falling?     Physical activity Stay physically fit. Do strength and balance exercises. Consider taking a regular class to build strength and balance. Yoga and tai chi are good options. Vision Have your eyes checked every year and your prescription for glasses or contacts updated as needed. Shoes and walking aids Wear non-skid shoes. Wear shoes that have rubber soles and low heels. Do not wear high heels. Do not walk around the house in socks or slippers. Use a cane or walker as told by your provider. Home safety Attach secure railings on both sides of your stairs. Install grab bars for your bathtub, shower, and toilet. Use a non-skid mat in your bathtub or shower. Attach bath mats securely with double-sided, non-slip rug tape. Use good lighting in all rooms. Keep a flashlight near your bed. Make sure there is a clear path from your bed to the bathroom. Use night-lights. Do not use throw rugs. Make sure all carpeting is taped or tacked down securely. Remove all clutter from walkways and stairways, including extension cords. Repair uneven or broken steps and floors. Avoid walking on icy or slippery surfaces. Walk on the grass instead of on icy or slick sidewalks. Use ice melter to get rid of ice on walkways in the winter. Use a cordless phone. Questions to ask your health care provider Can you help me check my risk for a fall? Do any of my medicines make me more likely to fall? Should I take a  vitamin D  supplement? What exercises can I do to improve my strength and balance? Should I make an appointment to have my vision checked? Do I need a bone density test to check for weak bones (osteoporosis)? Would it help to use a cane or a walker? Where to find more information Centers for Disease Control and Prevention, STEADI: TonerPromos.no Community-Based Fall Prevention Programs: TonerPromos.no [employer] on Aging: BaseRingTones.pl Contact a health care provider if: You fall at home. You are afraid of falling at home. You feel weak, drowsy, or dizzy. This information is not intended to replace advice given to you by your health care provider. Make sure you discuss any questions you have with your health care provider. Document Revised: 07/07/2022 Document Reviewed: 07/07/2022 Elsevier Patient Education  2024 [employer].

## 2024-06-06 ENCOUNTER — Ambulatory Visit (HOSPITAL_COMMUNITY)
Admission: RE | Admit: 2024-06-06 | Discharge: 2024-06-06 | Disposition: A | Discharge status: Home / Self Care | Source: Ambulatory Visit | Attending: Internal Medicine | Admitting: Internal Medicine

## 2024-06-06 ENCOUNTER — Ambulatory Visit (HOSPITAL_COMMUNITY)
Admission: RE | Admit: 2024-06-06 | Discharge: 2024-06-06 | Disposition: A | Discharge status: Home / Self Care | Source: Ambulatory Visit | Attending: Internal Medicine

## 2024-06-06 DIAGNOSIS — M8588 Other specified disorders of bone density and structure, other site: Secondary | ICD-10-CM | POA: Diagnosis not present

## 2024-06-06 DIAGNOSIS — Z780 Asymptomatic menopausal state: Secondary | ICD-10-CM | POA: Insufficient documentation

## 2024-06-06 DIAGNOSIS — Z1231 Encounter for screening mammogram for malignant neoplasm of breast: Secondary | ICD-10-CM | POA: Insufficient documentation

## 2024-06-11 ENCOUNTER — Ambulatory Visit: Payer: Self-pay | Admitting: Family Medicine

## 2024-06-13 ENCOUNTER — Telehealth: Payer: Self-pay

## 2024-06-13 NOTE — Telephone Encounter (Signed)
 Was Calling to inform the patient that the Dexa Scan shows that sh osteopenic. Meade recommended OTC vit D 600iu daily and Calcium  1200mg  daily for bone health.

## 2024-06-15 ENCOUNTER — Encounter: Payer: Self-pay | Admitting: Internal Medicine

## 2024-07-15 ENCOUNTER — Encounter: Payer: Self-pay | Admitting: Family Medicine

## 2024-07-15 ENCOUNTER — Ambulatory Visit (INDEPENDENT_AMBULATORY_CARE_PROVIDER_SITE_OTHER): Admitting: Family Medicine

## 2024-07-15 VITALS — BP 122/80 | HR 68 | Resp 16 | Ht 64.5 in | Wt 156.4 lb

## 2024-07-15 DIAGNOSIS — R7301 Impaired fasting glucose: Secondary | ICD-10-CM | POA: Diagnosis not present

## 2024-07-15 DIAGNOSIS — R7303 Prediabetes: Secondary | ICD-10-CM

## 2024-07-15 DIAGNOSIS — E038 Other specified hypothyroidism: Secondary | ICD-10-CM | POA: Diagnosis not present

## 2024-07-15 DIAGNOSIS — E7849 Other hyperlipidemia: Secondary | ICD-10-CM

## 2024-07-15 DIAGNOSIS — E559 Vitamin D deficiency, unspecified: Secondary | ICD-10-CM

## 2024-07-15 NOTE — Patient Instructions (Addendum)
 I appreciate the opportunity to provide care to you today!    Follow up:  5 months  Labs: please stop by the lab today to get your blood drawn (CBC, CMP, TSH, Lipid profile, HgA1c, Vit D)  Before starting the garlic supplement, please also check with the veins specialist to get their approval   Please follow up if your symptoms worsen or fail to improve.   Attached with your AVS, you will find valuable resources for self-education. I highly recommend dedicating some time to thoroughly examine them.   Please continue to a heart-healthy diet and increase your physical activities. Try to exercise for at least five days a week.    It was a pleasure to see you and I look forward to continuing to work together on your health and well-being. Please do not hesitate to call the office if you need care or have questions about your care.  In case of emergency, please visit the Emergency Department for urgent care, or contact our clinic at [phone] to schedule an appointment. We're here to help you!   Have a wonderful day and week. With Gratitude, Dontavian Marchi MSN, FNP-BC

## 2024-07-15 NOTE — Progress Notes (Signed)
 Established Patient Office Visit  Subjective:  Patient ID: Carolyn Raymond, female    DOB: [birth year]  Age: 68 y.o. MRN: [id number]  CC:  Chief Complaint  Patient presents with   Follow-up    Wants to know if she can start back taking garlic supplements. She stopped it back when she was diagnosed with a blood clot but she wants to start back on it     HPI Carolyn Raymond is a 68 y.o. female with past medical history of  hyperlipidemia,prediabetes presents for f/u of  chronic medical conditions.  For the details of today's visit, please refer to the assessment and plan.     Past Medical History:  Diagnosis Date   Allergy    DVT (deep venous thrombosis) (HCC)    Hyperlipidemia    Thyroid  disease    nodules    Past Surgical History:  Procedure Laterality Date   CHOLECYSTECTOMY  07/18/1998   COLONOSCOPY N/A 04/13/2024   Procedure: COLONOSCOPY;  Surgeon: Shaaron Lamar HERO, MD;  Location: [facility];  Service: Endoscopy;  Laterality: N/A;  1:15 pm, ok rm 1/2   CYSTOSCOPY WITH BIOPSY N/A 06/05/2022   Procedure: CYSTOSCOPY WITH BIOPSY;  Surgeon: Sherrilee Belvie CROME, MD;  Location: [facility];  Service: Urology;  Laterality: N/A;   CYSTOSCOPY WITH FULGERATION N/A 06/05/2022   Procedure: CYSTOSCOPY WITH FULGERATION;  Surgeon: Sherrilee Belvie CROME, MD;  Location: [facility];  Service: Urology;  Laterality: N/A;   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    Family History  Problem Relation Age of Onset   Other Mother        age 60   Pancreatic cancer Father    Colon polyps Father        ?resection   Colon cancer Neg Hx     Social History   Socioeconomic History   Marital status: Married    Spouse name: Not on file   Number of children: Not on file   Years of education: Not on file   Highest education level: Not on file  Occupational History   Not on file  Tobacco  Use   Smoking status: Never   Smokeless tobacco: Never  Vaping Use   Vaping status: Never Used  Substance and Sexual Activity   Alcohol use: Not Currently   Drug use: No   Sexual activity: Yes    Birth control/protection: Post-menopausal  Other Topics Concern   Not on file  Social History Narrative   Not on file   Social Drivers of Health   Financial Resource Strain: Low Risk  (05/10/2024)   Overall Financial Resource Strain (CARDIA)    Difficulty of Paying Living Expenses: Not hard at all  Food Insecurity: No Food Insecurity (05/10/2024)   Hunger Vital Sign    Worried About Running Out of Food in the Last Year: Never true    Ran Out of Food in the Last Year: Never true  Transportation Needs: No Transportation Needs (05/10/2024)   PRAPARE - Administrator, Civil Service (Medical): No    Lack of Transportation (Non-Medical): No  Physical Activity: Insufficiently Active (05/10/2024)   Exercise Vital Sign    Days of Exercise per Week: 2 days    Minutes of Exercise per Session: 20 min  Stress: No Stress Concern Present (05/10/2024)   [employer] of  Occupational Health - Occupational Stress Questionnaire    Feeling of Stress: Not at all  Social Connections: Moderately Integrated (05/10/2024)   Social Connection and Isolation Panel    Frequency of Communication with Friends and Family: More than three times a week    Frequency of Social Gatherings with Friends and Family: More than three times a week    Attends Religious Services: More than 4 times per year    Active Member of [employer] or Organizations: No    Attends Banker Meetings: Never    Marital Status: Married  Catering manager Violence: Not At Risk (05/10/2024)   Humiliation, Afraid, Rape, and Kick questionnaire    Fear of Current or Ex-Partner: No    Emotionally Abused: No    Physically Abused: No    Sexually Abused: No    Outpatient Medications Prior to Visit  Medication Sig Dispense Refill    Ascorbic Acid (VITAMIN C PO) Take by mouth daily.     aspirin  EC 81 MG tablet Take 1 tablet (81 mg total) by mouth every other day. Swallow whole.     b complex vitamins capsule Take 1 capsule by mouth daily.     CALCIUM  PO Take 1,200 mg by mouth daily.     cholecalciferol (VITAMIN D3) 25 MCG (1000 UNIT) tablet Take 1,000 Units by mouth daily.     ezetimibe  (ZETIA ) 10 MG tablet Take 1 tablet (10 mg total) by mouth daily. 90 tablet 3   rosuvastatin  (CRESTOR ) 20 MG tablet Take 1 tablet (20 mg total) by mouth daily. 90 tablet 0   No facility-administered medications prior to visit.    Allergies  Allergen Reactions   Bee Pollen Swelling    Can always tell when they are around   Isovue [Iopamidol] Itching and Swelling    ROS Review of Systems  Constitutional:  Negative for chills and fever.  Eyes:  Negative for visual disturbance.  Respiratory:  Negative for chest tightness and shortness of breath.   Neurological:  Negative for dizziness and headaches.      Objective:    Physical Exam HENT:     Head: Normocephalic.     Mouth/Throat:     Mouth: Mucous membranes are moist.  Cardiovascular:     Rate and Rhythm: Normal rate.     Heart sounds: Normal heart sounds.  Pulmonary:     Effort: Pulmonary effort is normal.     Breath sounds: Normal breath sounds.  Neurological:     Mental Status: She is alert.     BP 122/80   Pulse 68   Resp 16   Ht 5' 4.5 (1.638 m)   Wt 156 lb 6.4 oz (70.9 kg)   SpO2 96%   BMI 26.43 kg/m  Wt Readings from Last 3 Encounters:  07/15/24 156 lb 6.4 oz (70.9 kg)  05/10/24 153 lb (69.4 kg)  04/13/24 154 lb (69.9 kg)    Lab Results  Component Value Date   TSH 0.599 07/15/2024   Lab Results  Component Value Date   WBC 6.4 07/15/2024   HGB 13.8 07/15/2024   HCT 42.7 07/15/2024   MCV 95 07/15/2024   PLT 252 07/15/2024   Lab Results  Component Value Date   NA 139 07/15/2024   K 4.8 07/15/2024   CO2 23 07/15/2024   GLUCOSE 85  07/15/2024   BUN 12 07/15/2024   CREATININE 0.87 07/15/2024   BILITOT 0.5 07/15/2024   ALKPHOS 127 (H) 07/15/2024   AST  21 07/15/2024   ALT 17 07/15/2024   PROT 7.4 07/15/2024   ALBUMIN 4.3 07/15/2024   CALCIUM  9.5 07/15/2024   ANIONGAP 10 08/31/2023   EGFR 73 07/15/2024   Lab Results  Component Value Date   CHOL 164 07/15/2024   Lab Results  Component Value Date   HDL 82 07/15/2024   Lab Results  Component Value Date   LDLCALC 71 07/15/2024   Lab Results  Component Value Date   TRIG 52 07/15/2024   Lab Results  Component Value Date   CHOLHDL 2.0 07/15/2024   Lab Results  Component Value Date   HGBA1C 5.8 (H) 07/15/2024      Assessment & Plan:  Other hyperlipidemia Assessment & Plan: She take rosuvastatin  20 mg daily and is ezetimibe  10 mg daily The patient was encouraged to make lifestyle changes, including avoiding simple carbohydrates such as cakes, sweet desserts, ice cream, soda (diet or regular), sweet tea, candies, chips, cookies, store-bought juices, excessive alcohol (more than 1-2 drinks per day), lemonade, artificial sweeteners, donuts, coffee creamers, and sugar-free products. Additionally, reducing the consumption of greasy, fatty foods and increasing physical activity were recommended. The patient verbalized understanding and is aware of the plan of care.  Lab Results  Component Value Date   CHOL 164 07/15/2024   HDL 82 07/15/2024   LDLCALC 71 07/15/2024   TRIG 52 07/15/2024   CHOLHDL 2.0 07/15/2024     Orders: -     Lipid panel -     CMP14+EGFR -     CBC with Differential/Platelet  Prediabetes Assessment & Plan: Lifestyle modifications for prediabetes were discussed, including adopting a heart-healthy diet and increasing physical activity. The patient was encouraged to decrease her intake of high-sugar foods and beverages. She verbalized understanding and is aware of the plan of care.    IFG (impaired fasting glucose) -     Hemoglobin  A1c  Vitamin D  deficiency -     VITAMIN D  25 Hydroxy (Vit-D Deficiency, Fractures)  TSH (thyroid -stimulating hormone deficiency) -     TSH + free T4  Note: This chart has been completed using Engineer, civil (consulting) software, and while attempts have been made to ensure accuracy, certain words and phrases may not be transcribed as intended.    Follow-up: Return in about 5 months (around 12/15/2024).   Kalijah Westfall, FNP

## 2024-07-16 ENCOUNTER — Ambulatory Visit: Payer: Self-pay | Admitting: Family Medicine

## 2024-07-16 LAB — CMP14+EGFR
ALT: 17 IU/L (ref 0–32)
AST: 21 IU/L (ref 0–40)
Albumin: 4.3 g/dL (ref 3.9–4.9)
Alkaline Phosphatase: 127 IU/L — ABNORMAL HIGH (ref 44–121)
BUN/Creatinine Ratio: 14 (ref 12–28)
BUN: 12 mg/dL (ref 8–27)
Bilirubin Total: 0.5 mg/dL (ref 0.0–1.2)
CO2: 23 mmol/L (ref 20–29)
Calcium: 9.5 mg/dL (ref 8.7–10.3)
Chloride: 103 mmol/L (ref 96–106)
Creatinine, Ser: 0.87 mg/dL (ref 0.57–1.00)
Globulin, Total: 3.1 g/dL (ref 1.5–4.5)
Glucose: 85 mg/dL (ref 70–99)
Potassium: 4.8 mmol/L (ref 3.5–5.2)
Sodium: 139 mmol/L (ref 134–144)
Total Protein: 7.4 g/dL (ref 6.0–8.5)
eGFR: 73 mL/min/1.73 (ref >59)

## 2024-07-16 LAB — LIPID PANEL
Chol/HDL Ratio: 2 ratio (ref 0.0–4.4)
Cholesterol, Total: 164 mg/dL (ref 100–199)
HDL: 82 mg/dL (ref >39)
LDL Chol Calc (NIH): 71 mg/dL (ref 0–99)
Triglycerides: 52 mg/dL (ref 0–149)
VLDL Cholesterol Cal: 11 mg/dL (ref 5–40)

## 2024-07-16 LAB — CBC WITH DIFFERENTIAL/PLATELET
Basophils Absolute: 0 x10E3/uL (ref 0.0–0.2)
Basos: 1 %
EOS (ABSOLUTE): 0.2 x10E3/uL (ref 0.0–0.4)
Eos: 3 %
Hematocrit: 42.7 % (ref 34.0–46.6)
Hemoglobin: 13.8 g/dL (ref 11.1–15.9)
Immature Grans (Abs): 0 x10E3/uL (ref 0.0–0.1)
Immature Granulocytes: 0 %
Lymphocytes Absolute: 2.2 x10E3/uL (ref 0.7–3.1)
Lymphs: 34 %
MCH: 30.6 pg (ref 26.6–33.0)
MCHC: 32.3 g/dL (ref 31.5–35.7)
MCV: 95 fL (ref 79–97)
Monocytes Absolute: 0.5 x10E3/uL (ref 0.1–0.9)
Monocytes: 8 %
Neutrophils Absolute: 3.5 x10E3/uL (ref 1.4–7.0)
Neutrophils: 54 %
Platelets: 252 x10E3/uL (ref 150–450)
RBC: 4.51 x10E6/uL (ref 3.77–5.28)
RDW: 12.8 % (ref 11.7–15.4)
WBC: 6.4 x10E3/uL (ref 3.4–10.8)

## 2024-07-16 LAB — HEMOGLOBIN A1C
Est. average glucose Bld gHb Est-mCnc: 120 mg/dL
Hgb A1c MFr Bld: 5.8 % — ABNORMAL HIGH (ref 4.8–5.6)

## 2024-07-16 LAB — TSH+FREE T4
Free T4: 1.02 ng/dL (ref 0.82–1.77)
TSH: 0.599 u[IU]/mL (ref 0.450–4.500)

## 2024-07-16 LAB — VITAMIN D 25 HYDROXY (VIT D DEFICIENCY, FRACTURES): Vit D, 25-Hydroxy: 36.3 ng/mL (ref 30.0–100.0)

## 2024-07-16 NOTE — Assessment & Plan Note (Signed)
 Lifestyle modifications for prediabetes were discussed, including adopting a heart-healthy diet and increasing physical activity. The patient was encouraged to decrease her intake of high-sugar foods and beverages. She verbalized understanding and is aware of the plan of care.

## 2024-07-16 NOTE — Assessment & Plan Note (Signed)
 She take rosuvastatin  20 mg daily and is ezetimibe  10 mg daily The patient was encouraged to make lifestyle changes, including avoiding simple carbohydrates such as cakes, sweet desserts, ice cream, soda (diet or regular), sweet tea, candies, chips, cookies, store-bought juices, excessive alcohol (more than 1-2 drinks per day), lemonade, artificial sweeteners, donuts, coffee creamers, and sugar-free products. Additionally, reducing the consumption of greasy, fatty foods and increasing physical activity were recommended. The patient verbalized understanding and is aware of the plan of care.  Lab Results  Component Value Date   CHOL 164 07/15/2024   HDL 82 07/15/2024   LDLCALC 71 07/15/2024   TRIG 52 07/15/2024   CHOLHDL 2.0 07/15/2024

## 2024-08-15 ENCOUNTER — Ambulatory Visit: Admitting: Gastroenterology

## 2024-08-15 ENCOUNTER — Encounter: Payer: Self-pay | Admitting: Gastroenterology

## 2024-08-15 VITALS — BP 132/83 | HR 72 | Temp 98.5°F | Ht 64.5 in | Wt 161.2 lb

## 2024-08-15 DIAGNOSIS — Z8719 Personal history of other diseases of the digestive system: Secondary | ICD-10-CM | POA: Diagnosis not present

## 2024-08-15 DIAGNOSIS — Z860101 Personal history of adenomatous and serrated colon polyps: Secondary | ICD-10-CM | POA: Diagnosis not present

## 2024-08-15 NOTE — Patient Instructions (Signed)
 Continue to monitor for any change in bowels, recurrent blood in the stool. If you have any concerns, please reach out. Your next colonoscopy will be due in 03/2027. Return office visit as needed.

## 2024-08-15 NOTE — Progress Notes (Unsigned)
 GI Office Note    Referring Provider: Zarwolo, Gloria, FNP Primary Care Physician:  Zarwolo, Gloria, FNP  Primary Gastroenterologist: Ozell Hollingshead, MD   Chief Complaint   Chief Complaint  Patient presents with   Follow-up    Doing well, no issues    History of Present Illness   Carolyn Raymond is a 68 y.o. female presenting today for follow up. Seen in 03/2024 for rectal bleeding in setting of Eliquis  for DVT LLE (08/2023). In 02/2024, she had negative cologuard.   Discussed the use of AI scribe software for clinical note transcription with the patient, who gave verbal consent to proceed.  History of Present Illness   Carolyn Raymond is a 68 year old female who presents for follow-up after polyp removal and evaluation of gastrointestinal symptoms.  She underwent a procedure to remove a 15mm polyp located in the rectal area, identified after a negative Cologuard test and persistent rectal bleeding while on blood thinners. The bleeding resolved post-polyp removal. She is no longer on blood thinners but takes aspirin .  No issues with bowel movements. No heartburn, indigestion, or abdominal pain. Experiences mild digestive discomfort after consuming certain foods like Fritos and peanuts. Has diverticulosis and maintains a high-fiber diet, including oatmeal and blueberries.  History of a blood clot in the leg, treated by a vein specialist. The clot was located in the groin area, initially causing significant leg swelling. She attributes the clot to hormone therapy after being biopsied for endometrial cancer. She stopped the hormone therapy and wears compression stockings to manage swelling.  Notes persistent puffiness in her face since the blood clot, suspecting it might be related to lymphedema. Has not discussed this facial swelling with her primary care provider.  Family history includes pancreatic cancer in her father, but no known history of blood clots among close  relatives.       Wt Readings from Last 3 Encounters:  08/15/24 161 lb 3.2 oz (73.1 kg)  07/15/24 156 lb 6.4 oz (70.9 kg)  05/10/24 153 lb (69.4 kg)     Prior Data   Cologuard 02/2024: negative  Colonoscopy 03/2024: -non-bleeding internal hemorrhoids -diverticulosis -one 15mm polyp in rectum removed, tubular adneoma -colonoscopy in 3 years   Colonoscopy 06/2008:  IMPRESSION:  1. Normal rectum, diminutive polyp at rectosigmoid, status post cold      biopsy removal.  2. 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  Remainder of the colonic mucosa and      terminal ileal mucosa appeared normal.  Medications   Current Outpatient Medications  Medication Sig Dispense Refill   Ascorbic Acid (VITAMIN C PO) Take by mouth daily.     aspirin  EC 81 MG tablet Take 1 tablet (81 mg total) by mouth every other day. Swallow whole.     b complex vitamins capsule Take 1 capsule by mouth daily.     CALCIUM  PO Take 1,200 mg by mouth daily.     cholecalciferol (VITAMIN D3) 25 MCG (1000 UNIT) tablet Take 1,000 Units by mouth daily.     ezetimibe  (ZETIA ) 10 MG tablet Take 1 tablet (10 mg total) by mouth daily. 90 tablet 3   rosuvastatin  (CRESTOR ) 20 MG tablet Take 1 tablet (20 mg total) by mouth daily. 90 tablet 0   No current facility-administered medications for this visit.    Allergies   Allergies as of 08/15/2024 - Review Complete 08/15/2024  Allergen Reaction Noted  Bee pollen Swelling 07/04/2013   Isovue [iopamidol] Itching and Swelling 07/04/2013     Past Medical History   Past Medical History:  Diagnosis Date   Allergy    DVT (deep venous thrombosis) (HCC)    Hyperlipidemia    Thyroid  disease    nodules    Past Surgical History   Past Surgical History:  Procedure Laterality Date   CHOLECYSTECTOMY  07/18/1998   COLONOSCOPY N/A 04/13/2024   Procedure: COLONOSCOPY;  Surgeon: Shaaron Lamar HERO, MD;  [facility];  Service: Endoscopy;  Laterality: N/A;  1:15 pm, ok rm 1/2   CYSTOSCOPY WITH BIOPSY N/A 06/05/2022   Procedure: CYSTOSCOPY WITH BIOPSY;  Surgeon: Sherrilee Belvie CROME, MD;  Location: [facility];  Service: Urology;  Laterality: N/A;   CYSTOSCOPY WITH FULGERATION N/A 06/05/2022   Procedure: CYSTOSCOPY WITH FULGERATION;  Surgeon: Sherrilee Belvie CROME, MD;  Location: [facility];  Service: Urology;  Laterality: N/A;   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    Past Family History   Family History  Problem Relation Age of Onset   Other Mother        age 34   Pancreatic cancer Father    Colon polyps Father        ?resection   Colon cancer Neg Hx     Past Social History   Social History   Socioeconomic History   Marital status: Married    Spouse name: Not on file   Number of children: Not on file   Years of education: Not on file   Highest education level: Not on file  Occupational History   Not on file  Tobacco Use   Smoking status: Never   Smokeless tobacco: Never  Vaping Use   Vaping status: Never Used  Substance and Sexual Activity   Alcohol use: Not Currently   Drug use: No   Sexual activity: Yes    Birth control/protection: Post-menopausal  Other Topics Concern   Not on file  Social History Narrative   Not on file   Social Drivers of Health   Financial Resource Strain: Low Risk  (05/10/2024)   Overall Financial Resource Strain (CARDIA)    Difficulty of Paying Living Expenses: Not hard at all  Food Insecurity: No Food Insecurity (05/10/2024)   Hunger Vital Sign    Worried About Running Out of Food in the Last Year: Never true    Ran Out of Food in the Last Year: Never true  Transportation Needs: No Transportation Needs (05/10/2024)   PRAPARE - Administrator, Civil Service (Medical): No    Lack of Transportation (Non-Medical): No  Physical  Activity: Insufficiently Active (05/10/2024)   Exercise Vital Sign    Days of Exercise per Week: 2 days    Minutes of Exercise per Session: 20 min  Stress: No Stress Concern Present (05/10/2024)   [employer] of Occupational Health - Occupational Stress Questionnaire    Feeling of Stress: Not at all  Social Connections: Moderately Integrated (05/10/2024)   Social Connection and Isolation Panel    Frequency of Communication with Friends and Family: More than three times a week    Frequency of Social Gatherings with Friends and Family: More than three times a week    Attends Religious Services: More than 4 times per year    Active Member of [employer]  or Organizations: No    Attends Banker Meetings: Never    Marital Status: Married  Catering manager Violence: Not At Risk (05/10/2024)   Humiliation, Afraid, Rape, and Kick questionnaire    Fear of Current or Ex-Partner: No    Emotionally Abused: No    Physically Abused: No    Sexually Abused: No    Review of Systems   General: Negative for anorexia, weight loss, fever, chills, fatigue, weakness. ENT: Negative for hoarseness, difficulty swallowing , nasal congestion. CV: Negative for chest pain, angina, palpitations, dyspnea on exertion, peripheral edema.  Respiratory: Negative for dyspnea at rest, dyspnea on exertion, cough, sputum, wheezing.  GI: See history of present illness. GU:  Negative for dysuria, hematuria, urinary incontinence, urinary frequency, nocturnal urination.  Endo: Negative for unusual weight change.     Physical Exam   BP 132/83 (BP Location: Right Arm, Patient Position: Sitting, Cuff Size: Normal)   Pulse 72   Temp 98.5 F (36.9 C) (Oral)   Ht 5' 4.5 (1.638 m)   Wt 161 lb 3.2 oz (73.1 kg)   SpO2 98%   BMI 27.24 kg/m    General: Well-nourished, well-developed in no acute distress.  Eyes: No icterus. Mouth: Oropharyngeal mucosa moist and pink   Abdomen: Bowel sounds are normal, nontender,  nondistended, no hepatosplenomegaly or masses,  no abdominal bruits or hernia , no rebound or guarding.  Rectal: not performed Extremities: No lower extremity edema. No clubbing or deformities. Neuro: Alert and oriented x 4   Skin: Warm and dry, no jaundice.   Psych: Alert and cooperative, normal mood and affect.  Labs   Lab Results  Component Value Date   NA 139 07/15/2024   CL 103 07/15/2024   K 4.8 07/15/2024   CO2 23 07/15/2024   BUN 12 07/15/2024   CREATININE 0.87 07/15/2024   EGFR 73 07/15/2024   CALCIUM  9.5 07/15/2024   ALBUMIN 4.3 07/15/2024   GLUCOSE 85 07/15/2024   Lab Results  Component Value Date   WBC 6.4 07/15/2024   HGB 13.8 07/15/2024   HCT 42.7 07/15/2024   MCV 95 07/15/2024   PLT 252 07/15/2024   Lab Results  Component Value Date   ALT 17 07/15/2024   AST 21 07/15/2024   ALKPHOS 127 (H) 07/15/2024   BILITOT 0.5 07/15/2024   Lab Results  Component Value Date   TSH 0.599 07/15/2024   Lab Results  Component Value Date   HGBA1C 5.8 (H) 07/15/2024    Imaging Studies   No results found.  Assessment/Plan:           Sonny RAMAN. Ezzard, MHS, PA-C [facility]

## 2024-10-28 DIAGNOSIS — I87393 Chronic venous hypertension (idiopathic) with other complications of bilateral lower extremity: Secondary | ICD-10-CM | POA: Diagnosis not present

## 2024-10-28 DIAGNOSIS — I872 Venous insufficiency (chronic) (peripheral): Secondary | ICD-10-CM | POA: Diagnosis not present

## 2024-10-28 DIAGNOSIS — R600 Localized edema: Secondary | ICD-10-CM | POA: Diagnosis not present

## 2024-12-09 ENCOUNTER — Telehealth: Payer: Self-pay

## 2024-12-09 NOTE — Telephone Encounter (Signed)
 Copied from CRM #[id number]. Topic: Clinical - Medication Refill >> Dec 09, 2024  2:44 PM Delon T wrote: Medication: rosuvastatin  (CRESTOR ) 20 MG tablet  Has the patient contacted their pharmacy? Yes (Agent: If no, request that the patient contact the pharmacy for the refill. If patient does not wish to contact the pharmacy document the reason why and proceed with request.) (Agent: If yes, when and what did the pharmacy advise?)  This is the patient's preferred pharmacy:    [id number] - [place], [place] - [address] AT [facility] [address] [place] [address] Phone: [phone] Fax: [phone]  Is this the correct pharmacy for this prescription? Yes If no, delete pharmacy and type the correct one.   Has the prescription been filled recently? Yes  Is the patient out of the medication? Yes  Has the patient been seen for an appointment in the last year OR does the patient have an upcoming appointment? Yes  Can we respond through MyChart? Yes  Agent: Please be advised that Rx refills may take up to 3 business days. We ask that you follow-up with your pharmacy.

## 2024-12-13 ENCOUNTER — Other Ambulatory Visit: Payer: Self-pay

## 2024-12-13 DIAGNOSIS — E7849 Other hyperlipidemia: Secondary | ICD-10-CM

## 2024-12-13 MED ORDER — ROSUVASTATIN CALCIUM 20 MG PO TABS: 20.0000 mg | ORAL_TABLET | Freq: Every day | ORAL | 0 refills | Status: AC

## 2024-12-13 NOTE — Telephone Encounter (Signed)
 Sent to pharmacy

## 2024-12-14 ENCOUNTER — Other Ambulatory Visit: Payer: Self-pay | Admitting: Family Medicine

## 2024-12-14 DIAGNOSIS — E7849 Other hyperlipidemia: Secondary | ICD-10-CM

## 2024-12-15 ENCOUNTER — Ambulatory Visit: Admitting: Family Medicine

## 2024-12-16 ENCOUNTER — Other Ambulatory Visit: Payer: Self-pay | Admitting: Family Medicine

## 2024-12-16 DIAGNOSIS — E7849 Other hyperlipidemia: Secondary | ICD-10-CM

## 2024-12-21 ENCOUNTER — Ambulatory Visit: Admitting: Obstetrics & Gynecology

## 2024-12-21 ENCOUNTER — Encounter: Payer: Self-pay | Admitting: Obstetrics & Gynecology

## 2024-12-21 VITALS — BP 139/85 | HR 76 | Ht 64.5 in | Wt 158.2 lb

## 2024-12-21 DIAGNOSIS — N950 Postmenopausal bleeding: Secondary | ICD-10-CM

## 2024-12-21 DIAGNOSIS — Z86718 Personal history of other venous thrombosis and embolism: Secondary | ICD-10-CM

## 2024-12-21 NOTE — Progress Notes (Signed)
 "  GYN VISIT Patient name: Carolyn Raymond MRN [id number]  Date of birth: Jul 22, 1956 Chief Complaint:   Follow-up (Saw some blood/brown after sex)  History of Present Illness:   Carolyn Raymond is a 69 y.o. [phone] female being seen today for the following concerns:  -Oct 2024- diagnosed with DVT and she was advised to stop progesterone therapy  For quite some time she did not have any bleeding.    -Recently had intercourse- had some spotting.  Noted a pink to brown discharge- saw the bleeding that one time and thought it may have been due to vaginal dryness.  This occurred about 2 weeks ago and has not had any bleeding since then.  Has been sexually active since that time  Denies pelvic or abdominal pain.  Denies urinary concerns.     No LMP recorded. Patient is postmenopausal.    Review of Systems:   Pertinent items are noted in HPI Denies fever/chills, dizziness, headaches, visual disturbances, fatigue, shortness of breath, chest pain, abdominal pain, vomiting Pertinent History Reviewed:   Past Surgical History:  Procedure Laterality Date   CHOLECYSTECTOMY  07/18/1998   COLONOSCOPY N/A 04/13/2024   Procedure: COLONOSCOPY;  Surgeon: Shaaron Lamar HERO, MD;  [facility];  Service: Endoscopy;  Laterality: N/A;  1:15 pm, ok rm 1/2   CYSTOSCOPY WITH BIOPSY N/A 06/05/2022   Procedure: CYSTOSCOPY WITH BIOPSY;  Surgeon: Sherrilee Belvie CROME, MD;  Location: [facility];  Service: Urology;  Laterality: N/A;   CYSTOSCOPY WITH FULGERATION N/A 06/05/2022   Procedure: CYSTOSCOPY WITH FULGERATION;  Surgeon: Sherrilee Belvie CROME, MD;  Location: [facility];  Service: Urology;  Laterality: N/A;   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    Past Medical History:  Diagnosis Date   Allergy    DVT (deep venous thrombosis) (HCC)    Hyperlipidemia    Thyroid  disease    nodules    Reviewed problem list, medications and allergies. Physical Assessment:   Vitals:   12/21/24 1459  BP: 139/85  Pulse: 76  Weight: 158 lb 3.2 oz (71.8 kg)  Height: 5' 4.5 (1.638 m)  Body mass index is 26.74 kg/m.       Physical Examination:   General appearance: alert, well appearing, and in no distress  Psych: mood appropriate, normal affect  Skin: warm & dry   Cardiovascular: normal heart rate noted  Respiratory: normal respiratory effort, no distress  Abdomen: soft, non-tender   Pelvic: Normal urethra.  VULVA: normal appearing vulva with no masses, tenderness or lesions-normal external genitalia VAGINA: normal appearing vagina with normal color and discharge, no lesions, CERVIX: normal appearing cervix without discharge or lesions.  No visible evidence of bleeding.  Chaperone: Alan Fischer    Assessment & Plan:  1) PMB with h/o proliferative endometrium - Discussed my concern that the spotting may due to proliferative endometrium now that she has discontinued the progesterone.  However, based on history patient suspects spotting may have been due to dryness - Will plan for pelvic ultrasound, should lining appear thickened would recommend hysteroscopy D&C - Will also touch base with PCP regarding restarting Prometrium versus considering surgical intervention   Orders Placed This Encounter  Procedures   US  PELVIC COMPLETE WITH TRANSVAGINAL    Return for pelvic US  then visit with me after .   Rickardo Brinegar, DO Attending Obstetrician &  Gynecologist, Biochemist, Clinical for [employer], [facility]    "

## 2025-01-03 ENCOUNTER — Other Ambulatory Visit

## 2025-01-03 ENCOUNTER — Ambulatory Visit: Admitting: Obstetrics & Gynecology

## 2025-01-25 ENCOUNTER — Ambulatory Visit: Payer: Self-pay | Admitting: Nurse Practitioner

## 2025-05-15 ENCOUNTER — Ambulatory Visit
